# Patient Record
Sex: Female | Born: 1935 | Race: White | Hispanic: No | Smoking: Former smoker
Health system: Southern US, Community
[De-identification: ages and names within clinical notes are randomized; demographics above are authoritative.]

## PROBLEM LIST (undated history)

## (undated) DIAGNOSIS — R197 Diarrhea, unspecified: Secondary | ICD-10-CM

## (undated) DIAGNOSIS — K219 Gastro-esophageal reflux disease without esophagitis: Secondary | ICD-10-CM

## (undated) DIAGNOSIS — Z8489 Family history of other specified conditions: Secondary | ICD-10-CM

## (undated) DIAGNOSIS — T8859XA Other complications of anesthesia, initial encounter: Secondary | ICD-10-CM

## (undated) DIAGNOSIS — M7580 Other shoulder lesions, unspecified shoulder: Secondary | ICD-10-CM

## (undated) DIAGNOSIS — M6530 Trigger finger, unspecified finger: Secondary | ICD-10-CM

## (undated) DIAGNOSIS — M79609 Pain in unspecified limb: Secondary | ICD-10-CM

## (undated) DIAGNOSIS — E785 Hyperlipidemia, unspecified: Secondary | ICD-10-CM

## (undated) DIAGNOSIS — M25569 Pain in unspecified knee: Secondary | ICD-10-CM

## (undated) DIAGNOSIS — M949 Disorder of cartilage, unspecified: Secondary | ICD-10-CM

## (undated) DIAGNOSIS — I10 Essential (primary) hypertension: Secondary | ICD-10-CM

## (undated) DIAGNOSIS — M2550 Pain in unspecified joint: Secondary | ICD-10-CM

## (undated) DIAGNOSIS — F0390 Unspecified dementia without behavioral disturbance: Secondary | ICD-10-CM

## (undated) DIAGNOSIS — R112 Nausea with vomiting, unspecified: Secondary | ICD-10-CM

## (undated) DIAGNOSIS — R5381 Other malaise: Secondary | ICD-10-CM

## (undated) DIAGNOSIS — T4145XA Adverse effect of unspecified anesthetic, initial encounter: Secondary | ICD-10-CM

## (undated) DIAGNOSIS — M899 Disorder of bone, unspecified: Secondary | ICD-10-CM

## (undated) DIAGNOSIS — M1710 Unilateral primary osteoarthritis, unspecified knee: Secondary | ICD-10-CM

## (undated) DIAGNOSIS — J019 Acute sinusitis, unspecified: Secondary | ICD-10-CM

## (undated) DIAGNOSIS — E739 Lactose intolerance, unspecified: Secondary | ICD-10-CM

## (undated) DIAGNOSIS — H409 Unspecified glaucoma: Secondary | ICD-10-CM

## (undated) DIAGNOSIS — R5383 Other fatigue: Secondary | ICD-10-CM

## (undated) DIAGNOSIS — M25579 Pain in unspecified ankle and joints of unspecified foot: Secondary | ICD-10-CM

## (undated) DIAGNOSIS — J309 Allergic rhinitis, unspecified: Secondary | ICD-10-CM

## (undated) DIAGNOSIS — R7302 Impaired glucose tolerance (oral): Secondary | ICD-10-CM

## (undated) DIAGNOSIS — Z9889 Other specified postprocedural states: Secondary | ICD-10-CM

## (undated) DIAGNOSIS — H5440 Blindness, one eye, unspecified eye: Secondary | ICD-10-CM

## (undated) DIAGNOSIS — M25519 Pain in unspecified shoulder: Secondary | ICD-10-CM

## (undated) HISTORY — DX: Disorder of bone, unspecified: M89.9

## (undated) HISTORY — DX: Essential (primary) hypertension: I10

## (undated) HISTORY — DX: Diarrhea, unspecified: R19.7

## (undated) HISTORY — DX: Disorder of cartilage, unspecified: M94.9

## (undated) HISTORY — DX: Trigger finger, unspecified finger: M65.30

## (undated) HISTORY — PX: TUBAL LIGATION: SHX77

## (undated) HISTORY — DX: Unilateral primary osteoarthritis, unspecified knee: M17.10

## (undated) HISTORY — DX: Pain in unspecified ankle and joints of unspecified foot: M25.579

## (undated) HISTORY — DX: Blindness, one eye, unspecified eye: H54.40

## (undated) HISTORY — PX: OTHER SURGICAL HISTORY: SHX169

## (undated) HISTORY — DX: Pain in unspecified limb: M79.609

## (undated) HISTORY — DX: Pain in unspecified joint: M25.50

## (undated) HISTORY — DX: Hyperlipidemia, unspecified: E78.5

## (undated) HISTORY — DX: Other fatigue: R53.83

## (undated) HISTORY — DX: Unspecified glaucoma: H40.9

## (undated) HISTORY — DX: Allergic rhinitis, unspecified: J30.9

## (undated) HISTORY — DX: Lactose intolerance, unspecified: E73.9

## (undated) HISTORY — PX: EYE SURGERY: SHX253

## (undated) HISTORY — DX: Other shoulder lesions, unspecified shoulder: M75.80

## (undated) HISTORY — DX: Pain in unspecified shoulder: M25.519

## (undated) HISTORY — DX: Gastro-esophageal reflux disease without esophagitis: K21.9

## (undated) HISTORY — DX: Other malaise: R53.81

## (undated) HISTORY — DX: Impaired glucose tolerance (oral): R73.02

## (undated) HISTORY — DX: Pain in unspecified knee: M25.569

## (undated) HISTORY — DX: Acute sinusitis, unspecified: J01.90

## (undated) HISTORY — PX: COLONOSCOPY: SHX174

---

## 1997-10-09 ENCOUNTER — Ambulatory Visit: Admission: RE | Admit: 1997-10-09 | Discharge: 1997-10-09 | Payer: Self-pay | Admitting: Internal Medicine

## 1998-08-19 ENCOUNTER — Other Ambulatory Visit: Admission: RE | Admit: 1998-08-19 | Discharge: 1998-08-19 | Payer: Self-pay | Admitting: *Deleted

## 1999-08-28 ENCOUNTER — Other Ambulatory Visit: Admission: RE | Admit: 1999-08-28 | Discharge: 1999-08-28 | Payer: Self-pay | Admitting: Obstetrics and Gynecology

## 2000-10-11 ENCOUNTER — Other Ambulatory Visit: Admission: RE | Admit: 2000-10-11 | Discharge: 2000-10-11 | Payer: Self-pay | Admitting: Obstetrics and Gynecology

## 2004-05-06 ENCOUNTER — Ambulatory Visit: Payer: Self-pay | Admitting: Internal Medicine

## 2004-07-20 ENCOUNTER — Ambulatory Visit: Payer: Self-pay | Admitting: Internal Medicine

## 2004-07-27 ENCOUNTER — Ambulatory Visit: Payer: Self-pay | Admitting: Internal Medicine

## 2004-12-31 ENCOUNTER — Ambulatory Visit: Payer: Self-pay | Admitting: Internal Medicine

## 2005-01-01 ENCOUNTER — Ambulatory Visit: Payer: Self-pay | Admitting: Internal Medicine

## 2005-01-18 ENCOUNTER — Ambulatory Visit: Payer: Self-pay | Admitting: Internal Medicine

## 2005-01-20 ENCOUNTER — Ambulatory Visit: Payer: Self-pay | Admitting: Internal Medicine

## 2005-02-17 ENCOUNTER — Ambulatory Visit: Payer: Self-pay | Admitting: Internal Medicine

## 2005-04-21 ENCOUNTER — Ambulatory Visit: Payer: Self-pay | Admitting: Internal Medicine

## 2005-08-05 ENCOUNTER — Ambulatory Visit: Payer: Self-pay | Admitting: Internal Medicine

## 2005-08-11 ENCOUNTER — Ambulatory Visit: Payer: Self-pay | Admitting: Internal Medicine

## 2006-04-01 ENCOUNTER — Ambulatory Visit: Payer: Self-pay | Admitting: Internal Medicine

## 2006-08-18 ENCOUNTER — Ambulatory Visit: Payer: Self-pay | Admitting: Internal Medicine

## 2006-08-18 LAB — CONVERTED CEMR LAB
ALT: 23 units/L (ref 0–40)
AST: 26 units/L (ref 0–37)
Basophils Relative: 0.7 % (ref 0.0–1.0)
Bilirubin, Direct: 0.1 mg/dL (ref 0.0–0.3)
CO2: 28 meq/L (ref 19–32)
Calcium: 9.4 mg/dL (ref 8.4–10.5)
Chloride: 100 meq/L (ref 96–112)
Cholesterol: 172 mg/dL (ref 0–200)
Creatinine, Ser: 0.8 mg/dL (ref 0.4–1.2)
Eosinophils Absolute: 0.1 10*3/uL (ref 0.0–0.6)
Eosinophils Relative: 1.1 % (ref 0.0–5.0)
GFR calc non Af Amer: 75 mL/min
Glucose, Bld: 112 mg/dL — ABNORMAL HIGH (ref 70–99)
HCT: 40.4 % (ref 36.0–46.0)
Ketones, ur: NEGATIVE mg/dL
LDL Cholesterol: 76 mg/dL (ref 0–99)
Neutrophils Relative %: 64.5 % (ref 43.0–77.0)
Nitrite: NEGATIVE
Platelets: 248 10*3/uL (ref 150–400)
RBC: 4.4 M/uL (ref 3.87–5.11)
RDW: 12.2 % (ref 11.5–14.6)
Specific Gravity, Urine: 1.01 (ref 1.000–1.03)
Total Bilirubin: 0.7 mg/dL (ref 0.3–1.2)
Total CHOL/HDL Ratio: 2.2
Total Protein, Urine: NEGATIVE mg/dL
Urine Glucose: NEGATIVE mg/dL
Urobilinogen, UA: 0.2 (ref 0.0–1.0)
WBC: 6.6 10*3/uL (ref 4.5–10.5)

## 2007-01-18 DIAGNOSIS — I10 Essential (primary) hypertension: Secondary | ICD-10-CM

## 2007-01-18 DIAGNOSIS — E785 Hyperlipidemia, unspecified: Secondary | ICD-10-CM

## 2007-01-18 HISTORY — DX: Essential (primary) hypertension: I10

## 2007-01-18 HISTORY — DX: Hyperlipidemia, unspecified: E78.5

## 2007-04-13 ENCOUNTER — Ambulatory Visit: Payer: Self-pay | Admitting: Internal Medicine

## 2007-05-16 ENCOUNTER — Ambulatory Visit: Payer: Self-pay | Admitting: Internal Medicine

## 2007-05-16 DIAGNOSIS — M7580 Other shoulder lesions, unspecified shoulder: Secondary | ICD-10-CM

## 2007-05-16 DIAGNOSIS — J309 Allergic rhinitis, unspecified: Secondary | ICD-10-CM

## 2007-05-16 DIAGNOSIS — IMO0002 Reserved for concepts with insufficient information to code with codable children: Secondary | ICD-10-CM

## 2007-05-16 DIAGNOSIS — K219 Gastro-esophageal reflux disease without esophagitis: Secondary | ICD-10-CM

## 2007-05-16 DIAGNOSIS — M1710 Unilateral primary osteoarthritis, unspecified knee: Secondary | ICD-10-CM

## 2007-05-16 DIAGNOSIS — M25819 Other specified joint disorders, unspecified shoulder: Secondary | ICD-10-CM

## 2007-05-16 HISTORY — DX: Reserved for concepts with insufficient information to code with codable children: IMO0002

## 2007-05-16 HISTORY — DX: Allergic rhinitis, unspecified: J30.9

## 2007-05-16 HISTORY — DX: Other specified joint disorders, unspecified shoulder: M25.819

## 2007-05-16 HISTORY — DX: Gastro-esophageal reflux disease without esophagitis: K21.9

## 2007-05-29 ENCOUNTER — Ambulatory Visit: Payer: Self-pay | Admitting: Internal Medicine

## 2007-05-29 DIAGNOSIS — J019 Acute sinusitis, unspecified: Secondary | ICD-10-CM

## 2007-05-29 HISTORY — DX: Acute sinusitis, unspecified: J01.90

## 2007-07-31 ENCOUNTER — Encounter: Payer: Self-pay | Admitting: Internal Medicine

## 2007-08-28 ENCOUNTER — Ambulatory Visit: Payer: Self-pay | Admitting: Internal Medicine

## 2007-08-28 DIAGNOSIS — R5383 Other fatigue: Secondary | ICD-10-CM

## 2007-08-28 DIAGNOSIS — R5381 Other malaise: Secondary | ICD-10-CM | POA: Insufficient documentation

## 2007-08-28 HISTORY — DX: Other malaise: R53.81

## 2007-08-28 HISTORY — DX: Other fatigue: R53.83

## 2007-08-28 LAB — CONVERTED CEMR LAB
ALT: 23 units/L (ref 0–35)
AST: 23 units/L (ref 0–37)
Basophils Relative: 1.4 % — ABNORMAL HIGH (ref 0.0–1.0)
Bilirubin, Direct: 0.1 mg/dL (ref 0.0–0.3)
CO2: 28 meq/L (ref 19–32)
Calcium: 9.5 mg/dL (ref 8.4–10.5)
Chloride: 104 meq/L (ref 96–112)
Eosinophils Relative: 1.2 % (ref 0.0–5.0)
Glucose, Bld: 111 mg/dL — ABNORMAL HIGH (ref 70–99)
HCT: 37.6 % (ref 36.0–46.0)
Lymphocytes Relative: 23.9 % (ref 12.0–46.0)
Neutro Abs: 4.1 10*3/uL (ref 1.4–7.7)
Platelets: 260 10*3/uL (ref 150–400)
RBC: 4.16 M/uL (ref 3.87–5.11)
Total Protein: 7.1 g/dL (ref 6.0–8.3)
Triglycerides: 75 mg/dL (ref 0–149)
VLDL: 15 mg/dL (ref 0–40)
WBC: 6.4 10*3/uL (ref 4.5–10.5)

## 2007-09-05 ENCOUNTER — Encounter: Payer: Self-pay | Admitting: Internal Medicine

## 2007-09-05 ENCOUNTER — Ambulatory Visit: Payer: Self-pay | Admitting: Family Medicine

## 2007-09-26 ENCOUNTER — Encounter: Payer: Self-pay | Admitting: Internal Medicine

## 2007-09-26 DIAGNOSIS — M949 Disorder of cartilage, unspecified: Secondary | ICD-10-CM

## 2007-09-26 DIAGNOSIS — M899 Disorder of bone, unspecified: Secondary | ICD-10-CM

## 2007-09-26 HISTORY — DX: Disorder of bone, unspecified: M89.9

## 2008-03-15 ENCOUNTER — Ambulatory Visit: Payer: Self-pay | Admitting: Internal Medicine

## 2008-04-16 ENCOUNTER — Telehealth (INDEPENDENT_AMBULATORY_CARE_PROVIDER_SITE_OTHER): Payer: Self-pay | Admitting: *Deleted

## 2008-09-02 ENCOUNTER — Ambulatory Visit: Payer: Self-pay | Admitting: Internal Medicine

## 2008-09-02 DIAGNOSIS — E739 Lactose intolerance, unspecified: Secondary | ICD-10-CM

## 2008-09-02 HISTORY — DX: Lactose intolerance, unspecified: E73.9

## 2008-11-26 ENCOUNTER — Telehealth (INDEPENDENT_AMBULATORY_CARE_PROVIDER_SITE_OTHER): Payer: Self-pay | Admitting: *Deleted

## 2009-02-12 ENCOUNTER — Telehealth: Payer: Self-pay | Admitting: Internal Medicine

## 2009-04-03 ENCOUNTER — Ambulatory Visit: Payer: Self-pay | Admitting: Internal Medicine

## 2009-05-12 ENCOUNTER — Telehealth: Payer: Self-pay | Admitting: Internal Medicine

## 2009-07-10 ENCOUNTER — Ambulatory Visit: Payer: Self-pay | Admitting: Internal Medicine

## 2009-07-10 DIAGNOSIS — M25569 Pain in unspecified knee: Secondary | ICD-10-CM

## 2009-07-10 DIAGNOSIS — M6530 Trigger finger, unspecified finger: Secondary | ICD-10-CM

## 2009-07-10 DIAGNOSIS — M25519 Pain in unspecified shoulder: Secondary | ICD-10-CM

## 2009-07-10 HISTORY — DX: Pain in unspecified shoulder: M25.519

## 2009-07-10 HISTORY — DX: Pain in unspecified knee: M25.569

## 2009-07-10 HISTORY — DX: Trigger finger, unspecified finger: M65.30

## 2009-08-13 ENCOUNTER — Telehealth: Payer: Self-pay | Admitting: Internal Medicine

## 2009-08-19 ENCOUNTER — Telehealth (INDEPENDENT_AMBULATORY_CARE_PROVIDER_SITE_OTHER): Payer: Self-pay | Admitting: *Deleted

## 2009-08-19 HISTORY — PX: OTHER SURGICAL HISTORY: SHX169

## 2009-08-20 ENCOUNTER — Ambulatory Visit: Payer: Self-pay

## 2009-08-20 ENCOUNTER — Encounter (HOSPITAL_COMMUNITY): Admission: RE | Admit: 2009-08-20 | Discharge: 2009-10-21 | Payer: Self-pay | Admitting: Internal Medicine

## 2009-08-20 ENCOUNTER — Ambulatory Visit: Payer: Self-pay | Admitting: Cardiovascular Disease

## 2009-11-05 ENCOUNTER — Ambulatory Visit: Payer: Self-pay | Admitting: Internal Medicine

## 2009-11-05 LAB — CONVERTED CEMR LAB
Albumin: 3.4 g/dL — ABNORMAL LOW (ref 3.5–5.2)
BUN: 15 mg/dL (ref 6–23)
Basophils Absolute: 0.1 10*3/uL (ref 0.0–0.1)
CO2: 27 meq/L (ref 19–32)
Chloride: 100 meq/L (ref 96–112)
Cholesterol: 138 mg/dL (ref 0–200)
Glucose, Bld: 99 mg/dL (ref 70–99)
HCT: 31.9 % — ABNORMAL LOW (ref 36.0–46.0)
Hemoglobin, Urine: NEGATIVE
Hemoglobin: 10.9 g/dL — ABNORMAL LOW (ref 12.0–15.0)
Hgb A1c MFr Bld: 6 % (ref 4.6–6.5)
Iron: 51 ug/dL (ref 42–145)
Ketones, ur: NEGATIVE mg/dL
Lymphs Abs: 1.4 10*3/uL (ref 0.7–4.0)
MCHC: 34.1 g/dL (ref 30.0–36.0)
MCV: 87.4 fL (ref 78.0–100.0)
Monocytes Absolute: 0.5 10*3/uL (ref 0.1–1.0)
Neutro Abs: 8.6 10*3/uL — ABNORMAL HIGH (ref 1.4–7.7)
Platelets: 407 10*3/uL — ABNORMAL HIGH (ref 150.0–400.0)
Potassium: 3.8 meq/L (ref 3.5–5.1)
RDW: 14.6 % (ref 11.5–14.6)
Saturation Ratios: 16.9 % — ABNORMAL LOW (ref 20.0–50.0)
Sed Rate: 90 mm/hr — ABNORMAL HIGH (ref 0–22)
Specific Gravity, Urine: 1.025 (ref 1.000–1.030)
TSH: 1.73 microintl units/mL (ref 0.35–5.50)
Total Bilirubin: 0.5 mg/dL (ref 0.3–1.2)
Total Protein, Urine: NEGATIVE mg/dL
Urine Glucose: NEGATIVE mg/dL
Urobilinogen, UA: 1 (ref 0.0–1.0)
VLDL: 10.6 mg/dL (ref 0.0–40.0)
Vitamin B-12: 945 pg/mL — ABNORMAL HIGH (ref 211–911)

## 2009-11-06 LAB — CONVERTED CEMR LAB
PTH: 21.9 pg/mL (ref 14.0–72.0)
Vit D, 25-Hydroxy: 49 ng/mL (ref 30–89)

## 2009-11-12 ENCOUNTER — Encounter: Payer: Self-pay | Admitting: Internal Medicine

## 2009-11-12 ENCOUNTER — Ambulatory Visit: Payer: Self-pay | Admitting: Internal Medicine

## 2009-11-20 ENCOUNTER — Encounter: Payer: Self-pay | Admitting: Internal Medicine

## 2010-02-16 ENCOUNTER — Telehealth: Payer: Self-pay | Admitting: Internal Medicine

## 2010-02-24 ENCOUNTER — Ambulatory Visit: Payer: Self-pay | Admitting: Internal Medicine

## 2010-02-24 DIAGNOSIS — R197 Diarrhea, unspecified: Secondary | ICD-10-CM

## 2010-02-24 HISTORY — DX: Diarrhea, unspecified: R19.7

## 2010-03-09 ENCOUNTER — Telehealth: Payer: Self-pay | Admitting: Internal Medicine

## 2010-03-31 ENCOUNTER — Ambulatory Visit: Payer: Self-pay | Admitting: Internal Medicine

## 2010-04-17 ENCOUNTER — Ambulatory Visit (HOSPITAL_COMMUNITY)
Admission: RE | Admit: 2010-04-17 | Discharge: 2010-04-17 | Payer: Self-pay | Source: Home / Self Care | Attending: Orthopedic Surgery | Admitting: Orthopedic Surgery

## 2010-06-08 ENCOUNTER — Ambulatory Visit (HOSPITAL_COMMUNITY)
Admission: RE | Admit: 2010-06-08 | Discharge: 2010-06-09 | Payer: Self-pay | Source: Home / Self Care | Attending: Orthopedic Surgery | Admitting: Orthopedic Surgery

## 2010-07-19 LAB — CONVERTED CEMR LAB
ALT: 18 units/L (ref 0–35)
AST: 20 units/L (ref 0–37)
Alkaline Phosphatase: 76 units/L (ref 39–117)
CO2: 28 meq/L (ref 19–32)
Chloride: 104 meq/L (ref 96–112)
Creatinine, Ser: 0.7 mg/dL (ref 0.4–1.2)
Eosinophils Relative: 2.8 % (ref 0.0–5.0)
Folate: 15.5 ng/mL
HCT: 36.5 % (ref 36.0–46.0)
Hemoglobin: 12.7 g/dL (ref 12.0–15.0)
Iron: 65 ug/dL (ref 42–145)
Monocytes Absolute: 0.6 10*3/uL (ref 0.1–1.0)
Monocytes Relative: 6.1 % (ref 3.0–12.0)
Neutro Abs: 6.8 10*3/uL (ref 1.4–7.7)
Sed Rate: 36 mm/hr — ABNORMAL HIGH (ref 0–22)
TSH: 2.12 microintl units/mL (ref 0.35–5.50)
Total Bilirubin: 0.6 mg/dL (ref 0.3–1.2)
Total CHOL/HDL Ratio: 2.1
Vit D, 25-Hydroxy: 37 ng/mL (ref 30–89)
WBC: 10 10*3/uL (ref 4.5–10.5)

## 2010-07-23 NOTE — Assessment & Plan Note (Signed)
Summary: YEARLY FU/ LABS SAME DAY/ MEDICARE/ NWS   Vital Signs:  Patient profile:   77 year old female Height:      63 inches Weight:      137.50 pounds BMI:     24.45 O2 Sat:      99 % on Room air Temp:     96.7 degrees F oral Pulse rate:   81 / minute BP sitting:   120 / 62  (left arm) Cuff size:   regular  Vitals Entered ByZella Ball Ewing (Nov 05, 2009 8:20 AM)  O2 Flow:  Room air  CC: Yearly/RE   CC:  Yearly/RE.  History of Present Illness: overall doing well, Pt denies CP, sob, doe, wheezing, orthopnea, pnd, worsening LE edema, palps, dizziness or syncope   Pt denies new neuro symptoms such as headache, facial or extremity weakness   denies polydipsia or polyuria.  Trying to follow lower chol diet.  Has some mild ongoing fatigue without fever, wt loss, night sweats or other constitutional symptoms.  No osa symptoms.  Had recent left knee surgury arthroscopy but uncomplicated and trying to improve her overall level of activity as well.    Here for wellness Diet: Heart Healthy or DM if diabetic Physical Activities: Sedentary but will do better when knee improves postop Depression/mood screen: Negative Hearing: Intact bilateral Visual Acuity: Grossly normal, gets exam yearly, due for cataract surgury june 1 ADL's: Capable  Fall Risk: actually less now s/p left knee surgury Home Safety: Good Cognitive Impairment:  Gen appearance, affect, speech, memory, attention & motor skills grossly intact End-of-Life Planning: Advance directive - Full code/I agree   Preventive Screening-Counseling & Management      Drug Use:  no.    Problems Prior to Update: 1)  Preoperative Examination  (ICD-V72.84) 2)  Trigger Finger, Right Middle  (ICD-727.03) 3)  Shoulder Pain, Right  (ICD-719.41) 4)  Knee Pain, Left  (ICD-719.46) 5)  Glucose Intolerance  (ICD-271.3) 6)  Osteopenia  (ICD-733.90) 7)  Fatigue  (ICD-780.79) 8)  Sinusitis- Acute-nos  (ICD-461.9) 9)  Shoulder Impingement  Syndrome, Left  (ICD-726.2) 10)  Osteoarthritis, Knees, Bilateral  (ICD-715.96) 11)  Gerd  (ICD-530.81) 12)  Allergic Rhinitis  (ICD-477.9) 13)  Hypertension  (ICD-401.9) 14)  Hyperlipidemia  (ICD-272.4)  Medications Prior to Update: 1)  Amlodipine Besylate 5 Mg  Tabs (Amlodipine Besylate) .Marland Kitchen.. 1 By Mouth Qd 2)  Crestor 40 Mg  Tabs (Rosuvastatin Calcium) .... 1/2 By Mouth Once Daily 3)  Fexofenadine Hcl 180 Mg Tabs (Fexofenadine Hcl) .Marland Kitchen.. 1 By Mouth Once Daily 4)  Gabapentin 300 Mg Caps (Gabapentin) .... Take 1 Capsule By Mouth Three Times A Day 5)  Hydrochlorothiazide 25 Mg Tabs (Hydrochlorothiazide) .Marland Kitchen.. 1 By Mouth Once Daily 6)  Klor-Con 10 10 Meq Tbcr (Potassium Chloride) .Marland Kitchen.. 1 By Mouth Qd 7)  Micardis 80 Mg Tabs (Telmisartan) .... 1/2 By Mouth Once Daily 8)  Ecotrin Low Strength 81 Mg  Tbec (Aspirin) .Marland Kitchen.. 1po Qd 9)  Tramadol Hcl 50 Mg Tabs (Tramadol Hcl) .Marland Kitchen.. 1 - 2 By Mouth Q 6 Hrs As Needed Pain 10)  Fluticasone Propionate 50 Mcg/act Susp (Fluticasone Propionate) .... 2 Spray/side Once Daily  Current Medications (verified): 1)  Amlodipine Besylate 5 Mg  Tabs (Amlodipine Besylate) .Marland Kitchen.. 1 By Mouth Once Daily 2)  Crestor 40 Mg  Tabs (Rosuvastatin Calcium) .Marland Kitchen.. 1 By Mouth Once Daily 3)  Fexofenadine Hcl 180 Mg Tabs (Fexofenadine Hcl) .Marland Kitchen.. 1 By Mouth Once Daily 4)  Gabapentin 300 Mg  Caps (Gabapentin) .... Take 1 Capsule By Mouth Three Times A Day 5)  Hydrochlorothiazide 25 Mg Tabs (Hydrochlorothiazide) .Marland Kitchen.. 1 By Mouth Once Daily 6)  Klor-Con 10 10 Meq Tbcr (Potassium Chloride) .Marland Kitchen.. 1 By Mouth Once Daily 7)  Micardis 80 Mg Tabs (Telmisartan) .... 1/2 By Mouth Once Daily 8)  Ecotrin Low Strength 81 Mg  Tbec (Aspirin) .Marland Kitchen.. 1po Qd 9)  Tramadol Hcl 50 Mg Tabs (Tramadol Hcl) .Marland Kitchen.. 1 - 2 By Mouth Q 6 Hrs As Needed Pain 10)  Fluticasone Propionate 50 Mcg/act Susp (Fluticasone Propionate) .... 2 Spray/side Once Daily  Allergies (verified): 1)  ! 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, English as a foreign language Drug use-no  Risk Factors: Smoking Status: quit (05/16/2007)  Past Surgical History: Inguinal herniorrhaphy x 3 s/p left knee arthroscopy - march 2011 - Dr Ranell Patrick  Social History: Former Smoker Alcohol use-yes 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         all otherwise negative per pt -    Physical Exam  General:  alert and well-developed.   Head:  normocephalic and atraumatic.   Eyes:  vision grossly intact, pupils equal, and pupils round.   Ears:  R ear normal and L ear normal.   Nose:  no external deformity and no nasal discharge.   Mouth:  no gingival abnormalities and pharynx pink and moist.   Neck:  supple and no masses.   Lungs:  normal respiratory effort and normal breath sounds.   Heart:  normal rate and regular rhythm.   Abdomen:  soft, non-tender, and normal bowel sounds.   Msk:  no joint tenderness and no joint swelling.  , right knee with FROM, nontender , no  effusion Extremities:  no edema, no erythema  Neurologic:  cranial nerves II-XII intact and strength normal in all extremities.     Impression & Recommendations:  Problem # 1:  Preventive Health Care (ICD-V70.0)  Overall doing well, age appropriate education and counseling updated and referral for appropriate preventive services done unless declined, immunizations up to date or declined, diet counseling done if overweight, urged to quit smoking if smokes , most recent labs reviewed and current ordered if appropriate, ecg reviewed or declined (interpretation per ECG scanned in the EMR if done); information regarding Medicare Prevention requirements given if appropriate; speciality referrals updated as appropriate   Orders: First annual wellness visit with prevention plan  (U0454)  Problem # 2:  OSTEOPENIA (ICD-733.90)  due for f/u dxa - pt has number to call and plans to do after cataract surgury june 1; to also check PTH level, and vit d  Orders: T-Parathyroid Hormone, Intact w/ Calcium (09811-91478) T-Vitamin D (25-Hydroxy) (29562-13086)  Problem # 3:  GLUCOSE INTOLERANCE (ICD-271.3) asympt -  for a1c check;  to focus on diet and wt control;  does not need OHA at this time but to check a1c Orders: TLB-A1C / Hgb A1C (Glycohemoglobin) (83036-A1C)  Problem # 4:  HYPERTENSION (ICD-401.9)  Her updated medication list for this problem includes:    Amlodipine Besylate 5 Mg Tabs (Amlodipine besylate) .Marland Kitchen... 1 by mouth once daily    Hydrochlorothiazide 25 Mg Tabs (Hydrochlorothiazide) .Marland Kitchen... 1 by mouth once daily    Micardis 80 Mg Tabs (Telmisartan) .Marland Kitchen... 1/2 by mouth once daily  BP today: 120/62 Prior BP: 162/96 (07/10/2009)  Labs Reviewed: K+: 3.6 (09/02/2008) Creat: : 0.7 (09/02/2008)   Chol: 157 (09/02/2008)   HDL: 73.5 (09/02/2008)   LDL: 66 (09/02/2008)   TG: 90 (09/02/2008) stable overall by hx and exam, ok to continue meds/tx as is   Problem # 5:  HYPERLIPIDEMIA  (ICD-272.4)  Her updated medication list for this problem includes:    Crestor 40 Mg Tabs (Rosuvastatin calcium) .Marland Kitchen... 1 by mouth once daily  Orders: TLB-Lipid Panel (80061-LIPID) Prescription Created Electronically [phone])  Labs Reviewed: SGOT: 20 (09/02/2008)   SGPT: 18 (09/02/2008)   HDL:73.5 (09/02/2008), 75.5 (08/28/2007)  LDL:66 (09/02/2008), 66 (08/28/2007)  Chol:157 (09/02/2008), 156 (08/28/2007)  Trig:90 (09/02/2008), 75 (08/28/2007) stable overall by hx and exam, ok to continue meds/tx as is   Problem # 6:  FATIGUE (ICD-780.79) exam benign, to check labs below; follow with expectant management  Orders: TLB-BMP (Basic Metabolic Panel-BMET) (80048-METABOL) TLB-CBC Platelet - w/Differential (85025-CBCD) TLB-Hepatic/Liver Function Pnl (80076-HEPATIC) TLB-Sedimentation Rate (ESR) (85652-ESR) TLB-IBC Pnl (Iron/FE;Transferrin) (83550-IBC) TLB-B12 + Folate Pnl (82746_82607-B12/FOL) TLB-Udip ONLY (81003-UDIP) TLB-TSH (Thyroid Stimulating Hormone) (84443-TSH)  Complete Medication List: 1)  Amlodipine Besylate 5 Mg Tabs (Amlodipine besylate) .Marland Kitchen.. 1 by mouth once daily 2)  Crestor 40 Mg Tabs (Rosuvastatin calcium) .Marland Kitchen.. 1 by mouth once daily 3)  Fexofenadine Hcl 180 Mg Tabs (Fexofenadine hcl) .Marland Kitchen.. 1 by mouth once daily 4)  Gabapentin 300 Mg Caps (Gabapentin) .... Take 1 capsule by mouth three times a day 5)  Hydrochlorothiazide 25 Mg Tabs (Hydrochlorothiazide) .Marland Kitchen.. 1 by mouth once daily 6)  Klor-con 10 10 Meq Tbcr (Potassium chloride) .Marland Kitchen.. 1 by mouth once daily 7)  Micardis 80 Mg Tabs (Telmisartan) .... 1/2 by mouth once daily 8)  Ecotrin Low Strength 81 Mg Tbec (Aspirin) .Marland Kitchen.. 1po qd 9)  Tramadol Hcl 50 Mg Tabs (Tramadol hcl) .Marland Kitchen.. 1 - 2 by mouth q 6 hrs as needed pain 10)  Fluticasone Propionate 50 Mcg/act Susp (Fluticasone propionate) .... 2 spray/side once daily  Patient Instructions: 1)  please call for you yearly pap smear and mammogram 2)  please call for the bone density  test to be scheduled 3)  Please go to the Lab in the basement for your blood and/or urine tests today  4)  Continue all previous medications as before this visit  5)  Please schedule a follow-up appointment in 1 year or sooner if needed Prescriptions: AMLODIPINE BESYLATE 5 MG  TABS (AMLODIPINE BESYLATE) 1 by mouth once daily  #90 x 3   Entered and Authorized by:   Corwin Levins MD   Signed by:   Corwin Levins MD on 11/05/2009   Method used:   Electronically to        [facility] [phone]* (retail)       [address]       [place], [place]  [address]       Ph: [phone] or [phone]  Fax: [phone]   RxID:   [id number] MICARDIS 80 MG TABS (TELMISARTAN) 1/2 by mouth once daily  #90 x 3   Entered and Authorized by:   Corwin Levins MD   Signed by:   Corwin Levins MD on 11/05/2009   Method used:   Electronically to        [facility] [phone]* (retail)       [address], [place]  [address]       Ph: [phone] or [phone]       Fax: [phone]   RxID:   [id number] KLOR-CON 10 10 MEQ TBCR (POTASSIUM CHLORIDE) 1 by mouth once daily  #90 x 3   Entered and Authorized by:   Corwin Levins MD   Signed by:   Corwin Levins MD on 11/05/2009   Method used:   Electron[facility] to        [facility] [phone]* (retail)       [address]       [place], [place]  [address]       Ph: [phone] or [phone]       Fax: [phone]   RxID:   [id number] HYDROCHLOROTHIAZIDE 25 MG TABS (HYDROCHLOROTHIAZIDE) 1 by mouth once daily  #90 x 3   Entered and Authorized by:   Corwin Levins MD   Signed by:   Corwin Levins MD on 11/05/2009   Method used:   Electron[facility] to        [facility] [phone]* (retail)       [address] [place]  [address]       Ph: [phone] or [phone]       Fax: [phone]   RxID:   [id number] GABAPENTIN 300 MG CAPS (GABAPENTIN) Take 1 capsule by mouth three times a day  #90 x 5   Entered and Authorized  by:   Corwin Levins MD   Signed by:   Corwin Levins MD on 11/05/2009   Method used:   Electronically to        [facility] [phone]* (retail)       [address] [place]  [address]       Ph: [phone] or [phone]       Fax: [phone]   RxID:   [id number] FEXOFENADINE HCL 180 MG TABS (FEXOFENADINE HCL) 1 by mouth once daily  #90 x 3   Entered and Authorized by:   Corwin Levins MD   Signed by:   Corwin Levins MD on 11/05/2009   Method used:   [facility]ron[facility] to        [facility] [place] [facility] [phone]* (retail)       [address], [place]  [address]       Ph: [phone] or [phone]       Fax: [phone]   RxID:   [id number] CRESTOR 40 MG  TABS (ROSUVASTATIN CALCIUM) 1 by mouth once daily  #90 x 3   Entered and Authorized by:   Corwin Levins MD   Signed by:   Corwin Levins MD on 11/05/2009   Method used:   [facility]ronically to        [facility] [phone]* (retail)  [address]       [place], [place]  [address]       Ph: [phone] or [phone]       Fax: [phone]   RxID:   [id number]

## 2010-07-23 NOTE — Progress Notes (Signed)
Summary: Rx req  Phone Note Call from Patient Call back at Home Phone [phone]   Caller: Patient Summary of Call: Pt called stating that she has been having Diarrhea x 2 days, no fever, no abd pain. Pt has been using Immodium with no relief and is requesting an Rx to pharmacy. Initial call taken by: Margaret Pyle, CMA,  February 16, 2010 9:06 AM  Follow-up for Phone Call        ? any fever or blood?  abd pain, n/v? (stronger diarrhea med should be used if any of this going on) Follow-up by: Corwin Levins MD,  February 16, 2010 1:11 PM  Additional Follow-up for Phone Call Additional follow up Details #1::        Pt states, no fever, abd pain, n/v or blood. Pt does report bloating. Additional Follow-up by: Margaret Pyle, CMA,  February 16, 2010 2:01 PM    Additional Follow-up for Phone Call Additional follow up Details #2::    ok for as needed lomotil, but please ask pt to consider also taking metamucil daily, as this can help as well  Follow-up by: Corwin Levins MD,  February 16, 2010 2:13 PM  Additional Follow-up for Phone Call Additional follow up Details #3:: Details for Additional Follow-up Action Taken: Pt informed, [facility] Additional Follow-up by: Margaret Pyle, CMA,  February 16, 2010 3:01 PM  New/Updated Medications: LOMOTIL 2.5-0.025 MG TABS (DIPHENOXYLATE-ATROPINE) 1po as needed loose stool - max 8 tabs per 24 hrs Prescriptions: LOMOTIL 2.5-0.025 MG TABS (DIPHENOXYLATE-ATROPINE) 1po as needed loose stool - max 8 tabs per 24 hrs  #30 x 0   Entered and Authorized by:   Corwin Levins MD   Signed by:   Corwin Levins MD on 02/16/2010   Method used:   Print then Give to Patient   RxID:   [id number]  done hardcopy to LIM side B - dahlia  Corwin Levins MD  February 16, 2010 2:14 PM

## 2010-07-23 NOTE — Progress Notes (Signed)
Summary: Medical clearance  Phone Note Call from Patient Call back at Home Phone [phone]   Caller: Patient Summary of Call: pt called requesting medical letter clearing her for "knee scope" by Dr. Guss Bunde be faxed to his scheduler Natasha Mead @ [phone].  Initial call taken by: Margaret Pyle, CMA,  August 13, 2009 10:04 AM  Follow-up for Phone Call        ideally, she will need stress test due to her age and other risk factors prior to surgury - is she ok if I order that (would likely be final result by mid to late next wk) Follow-up by: Corwin Levins MD,  August 13, 2009 12:49 PM  Additional Follow-up for Phone Call Additional follow up Details #1::        pt informed and is okay with stress test. pt informed to expect call from [facility] with appt info Additional Follow-up by: Margaret Pyle, CMA,  August 13, 2009 1:03 PM  New Problems: PREOPERATIVE EXAMINATION (ICD-V72.84)   Additional Follow-up for Phone Call Additional follow up Details #2::    OK - I will order, and she should hear soon (will order the medication stress test due to her knee) Follow-up by: Corwin Levins MD,  August 13, 2009 1:07 PM  New Problems: PREOPERATIVE EXAMINATION (ICD-V72.84)

## 2010-07-23 NOTE — Miscellaneous (Signed)
Summary: BONE DENSITY  Clinical Lists Changes  Orders: Added new Test order of T-Bone Densitometry (77080) - Signed Added new Test order of T-Lumbar Vertebral Assessment (77082) - Signed 

## 2010-07-23 NOTE — Assessment & Plan Note (Signed)
Summary: FLU SHOT-LB   Nurse Visit   Allergies: 1)  ! Daypro  Orders Added: 1)  Flu Vaccine 48yrs + MEDICARE PATIENTS [Q2039] 2)  Administration Flu vaccine - MCR [G0008] Flu Vaccine Consent Questions     Do you have a history of severe allergic reactions to this vaccine? no    Any prior history of allergic reactions to egg and/or gelatin? no    Do you have a sensitivity to the preservative Thimersol? no    Do you have a past history of Guillan-Barre Syndrome? no    Do you currently have an acute febrile illness? no    Have you ever had a severe reaction to latex? no    Vaccine information given and explained to patient? yes    Are you currently pregnant? no    Lot Number:[id number]   Exp Date:12/19/2010   Site Given  Left Deltoid IMistration Flu vaccine - MCR [G0008] .lbmedflu

## 2010-07-23 NOTE — Assessment & Plan Note (Signed)
Summary: diarrhea/cd   Vital Signs:  Patient profile:   77 year old female Height:      62.5 inches Weight:      130.50 pounds BMI:     23.57 O2 Sat:      98 % on Room air Temp:     97 degrees F oral Pulse rate:   73 / minute BP sitting:   132 / 80  (left arm) Cuff size:   regular  Vitals Entered By: Zella Ball Ewing CMA (AAMA) (February 24, 2010 4:02 PM)  O2 Flow:  Room air CC: Diarrhea for 2 weeks/RE   CC:  Diarrhea for 2 weeks/RE.  History of Present Illness: here with c/o of just over 2 wks loose and watery stools wtihout blood, n/v, abd pain, fever, sick contacts, or recent antibx;  Has lost some wt over the past yr and has had incr stress recently due to husband's cancer.  No prior hx of this in past.  Food seems to "just go through: sometimes.  Pt denies CP, worsening sob, doe, wheezing, orthopnea, pnd, worsening LE edema, palps, dizziness or syncope  Pt denies new neuro symptoms such as headache, facial or extremity weakness  No fever, wt loss, night sweats, loss of appetite or other constitutional symptoms  Lomotil as needed seemed to help  Problems Prior to Update: 1)  Diarrhea  (ICD-787.91) 2)  Preventive Health Care  (ICD-V70.0) 3)  Preoperative Examination  (ICD-V72.84) 4)  Trigger Finger, Right Middle  (ICD-727.03) 5)  Shoulder Pain, Right  (ICD-719.41) 6)  Knee Pain, Left  (ICD-719.46) 7)  Glucose Intolerance  (ICD-271.3) 8)  Osteopenia  (ICD-733.90) 9)  Fatigue  (ICD-780.79) 10)  Sinusitis- Acute-nos  (ICD-461.9) 11)  Shoulder Impingement Syndrome, Left  (ICD-726.2) 12)  Osteoarthritis, Knees, Bilateral  (ICD-715.96) 13)  Gerd  (ICD-530.81) 14)  Allergic Rhinitis  (ICD-477.9) 15)  Hypertension  (ICD-401.9) 16)  Hyperlipidemia  (ICD-272.4)  Medications Prior to Update: 1)  Amlodipine Besylate 5 Mg  Tabs (Amlodipine Besylate) .Marland Kitchen.. 1 By Mouth Once Daily 2)  Crestor 40 Mg  Tabs (Rosuvastatin Calcium) .Marland Kitchen.. 1 By Mouth Once Daily 3)  Fexofenadine Hcl 180 Mg Tabs  (Fexofenadine Hcl) .Marland Kitchen.. 1 By Mouth Once Daily 4)  Gabapentin 300 Mg Caps (Gabapentin) .... Take 1 Capsule By Mouth Three Times A Day 5)  Hydrochlorothiazide 25 Mg Tabs (Hydrochlorothiazide) .Marland Kitchen.. 1 By Mouth Once Daily 6)  Klor-Con 10 10 Meq Tbcr (Potassium Chloride) .Marland Kitchen.. 1 By Mouth Once Daily 7)  Micardis 80 Mg Tabs (Telmisartan) .... 1/2 By Mouth Once Daily 8)  Ecotrin Low Strength 81 Mg  Tbec (Aspirin) .Marland Kitchen.. 1po Qd 9)  Tramadol Hcl 50 Mg Tabs (Tramadol Hcl) .Marland Kitchen.. 1 - 2 By Mouth Q 6 Hrs As Needed Pain 10)  Fluticasone Propionate 50 Mcg/act Susp (Fluticasone Propionate) .... 2 Spray/side Once Daily 11)  Lomotil 2.5-0.025 Mg Tabs (Diphenoxylate-Atropine) .Marland Kitchen.. 1po As Needed Loose Stool - Max 8 Tabs Per 24 Hrs  Current Medications (verified): 1)  Amlodipine Besylate 5 Mg  Tabs (Amlodipine Besylate) .Marland Kitchen.. 1 By Mouth Once Daily 2)  Crestor 40 Mg  Tabs (Rosuvastatin Calcium) .Marland Kitchen.. 1 By Mouth Once Daily 3)  Fexofenadine Hcl 180 Mg Tabs (Fexofenadine Hcl) .Marland Kitchen.. 1 By Mouth Once Daily 4)  Gabapentin 300 Mg Caps (Gabapentin) .... Take 1 Capsule By Mouth Three Times A Day 5)  Hydrochlorothiazide 25 Mg Tabs (Hydrochlorothiazide) .Marland Kitchen.. 1 By Mouth Once Daily 6)  Klor-Con 10 10 Meq Tbcr (Potassium Chloride) .Marland Kitchen.. 1 By  Mouth Once Daily 7)  Micardis 80 Mg Tabs (Telmisartan) .... 1/2 By Mouth Once Daily 8)  Ecotrin Low Strength 81 Mg  Tbec (Aspirin) .Marland Kitchen.. 1po Qd 9)  Tramadol Hcl 50 Mg Tabs (Tramadol Hcl) .Marland Kitchen.. 1 - 2 By Mouth Q 6 Hrs As Needed Pain 10)  Fluticasone Propionate 50 Mcg/act Susp (Fluticasone Propionate) .... 2 Spray/side Once Daily 11)  Lomotil 2.5-0.025 Mg Tabs (Diphenoxylate-Atropine) .Marland Kitchen.. 1po As Needed Loose Stool - Max 8 Tabs Per 24 Hrs  Allergies (verified): 1)  ! 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   Head:  normocephalic and atraumatic.   Eyes:  vision grossly intact, pupils equal, and pupils round.   Ears:  R ear normal and L ear normal.   Nose:  no external deformity and no nasal discharge.   Mouth:  no gingival abnormalities and pharynx pink and moist.   Neck:  supple and no masses.   Lungs:  normal respiratory effort and normal breath sounds.   Heart:  normal rate and regular rhythm.   Abdomen:  soft, non-tender, and normal bowel sounds.   Msk:  no joint tenderness and no joint swelling.   Extremities:  no edema, no erythema  Skin:  color normal and no rashes.   Psych:  not depressed appearing and slightly anxious.     Impression & Recommendations:  Problem # 1:  DIARRHEA (ICD-787.91)  Her updated medication list for this problem includes:    Lomotil 2.5-0.025 Mg Tabs (Diphenoxylate-atropine) .Marland Kitchen... 1po as needed loose stool - max 8 tabs per 24 hrs unclear etiology, exam bening - ? viral vs functinal vs other- ok to follow for now; with lomotil, and metamucil as needed, consider colestipol or welchol for relief as well;  consier GI referral if > 4 wks;  does not appear to need further labs today  Problem # 2:  HYPERTENSION (ICD-401.9)  Her updated medication list for this problem includes:    Amlodipine Besylate 5 Mg Tabs (Amlodipine besylate) .Marland Kitchen... 1 by mouth once daily    Hydrochlorothiazide 25 Mg Tabs (Hydrochlorothiazide) .Marland Kitchen... 1 by mouth once daily    Micardis 80 Mg Tabs (Telmisartan) .Marland Kitchen... 1/2 by  mouth once daily  BP today: 132/80 Prior BP: 120/62 (11/05/2009)  Labs Reviewed: K+: 3.8 (11/05/2009) Creat: : 0.6 (11/05/2009)   Chol: 138 (11/05/2009)   HDL: 73.00 (11/05/2009)   LDL: 54 (11/05/2009)   TG: 53.0 (11/05/2009) stable overall by hx and exam, ok to continue meds/tx as is   Complete Medication List: 1)  Amlodipine Besylate 5 Mg Tabs (Amlodipine besylate) .Marland Kitchen.. 1 by mouth once daily 2)  Crestor 40 Mg Tabs (Rosuvastatin calcium) .Marland Kitchen.. 1 by mouth once daily 3)  Fexofenadine Hcl 180 Mg Tabs (Fexofenadine hcl) .Marland Kitchen.. 1 by mouth once daily 4)  Gabapentin 300 Mg Caps (Gabapentin) .... Take 1 capsule by mouth three times a day 5)  Hydrochlorothiazide  25 Mg Tabs (Hydrochlorothiazide) .Marland Kitchen.. 1 by mouth once daily 6)  Klor-con 10 10 Meq Tbcr (Potassium chloride) .Marland Kitchen.. 1 by mouth once daily 7)  Micardis 80 Mg Tabs (Telmisartan) .... 1/2 by mouth once daily 8)  Ecotrin Low Strength 81 Mg Tbec (Aspirin) .Marland Kitchen.. 1po qd 9)  Tramadol Hcl 50 Mg Tabs (Tramadol hcl) .Marland Kitchen.. 1 - 2 by mouth q 6 hrs as needed pain 10)  Fluticasone Propionate 50 Mcg/act Susp (Fluticasone propionate) .... 2 spray/side once daily 11)  Lomotil 2.5-0.025 Mg Tabs (Diphenoxylate-atropine) .Marland Kitchen.. 1po as needed loose stool - max 8 tabs per 24 hrs  Patient Instructions: 1)  Please take all new medications as prescribed 2)  Continue all previous medications as before this visit  3)  Please also start Metamucil daily OTC 4)  call if this does not help, for prescription for "colestipol" 5)  Call if lasts more than 4 wks for GI referral, or sooner if develop fever, pain or blood 6)  Continue all previous medications as before this visit  7)  Please schedule a follow-up appointment in 6 months for your "yearly medicare exam", or sooner if needed Prescriptions: LOMOTIL 2.5-0.025 MG TABS (DIPHENOXYLATE-ATROPINE) 1po as needed loose stool - max 8 tabs per 24 hrs  #60 x 1   Entered and Authorized by:   Corwin Levins MD   Signed by:   Corwin Levins MD on 02/24/2010   Method used:   Print then Give to Patient   RxID:   [id number]

## 2010-07-23 NOTE — Assessment & Plan Note (Signed)
Summary: knee pain for mos/#/cd   Vital Signs:  Patient profile:   77 year old female Height:      61 inches Weight:      149 pounds BMI:     28.26 O2 Sat:      98 % on Room air Temp:     97.7 degrees F oral Pulse rate:   96 / minute BP sitting:   162 / 96  (left arm) Cuff size:   regular  Vitals Entered ByMarland Kitchen Zella Ball Ewing (July 10, 2009 11:24 AM)  O2 Flow:  Room air CC: knee pain, allergies,cyst on neck/RE   CC:  knee pain, allergies, and cyst on neck/RE.  History of Present Illness: here with several issues;  worst is left knee pain increased in the last 2 wks with warmth and swelling, with some sense of instability but no falls, fever, injury, hx of gout, wt loss, night sweats.  Also with several months ongoing right shoudler pain and decrased ROM without injury , neck pain or extrmeity pain, weakness or numbness; cannot remember any specific injury, and no loss of grip strength.  Does have trigger finger on the 3rd finger right hand for many months as well, no pain but does not work well adn she has to extend the finger manually to use it.  Pt denies CP, sob, doe, wheezing, orthopnea, pnd, worsening LE edema, palps, dizziness or syncope  Pt denies new neuro symptoms such as headache, facial or extremity weakness  Also with recent infected seb cyst to the ant neck tx with antibs and now pain, redness, sweling resolved, and wanted a "second opinion" as the dermatologist who tx it wanted to remove the cyst as well.    Problems Prior to Update: 1)  Trigger Finger, Right Middle  (ICD-727.03) 2)  Shoulder Pain, Right  (ICD-719.41) 3)  Knee Pain, Left  (ICD-719.46) 4)  Glucose Intolerance  (ICD-271.3) 5)  Osteopenia  (ICD-733.90) 6)  Fatigue  (ICD-780.79) 7)  Sinusitis- Acute-nos  (ICD-461.9) 8)  Shoulder Impingement Syndrome, Left  (ICD-726.2) 9)  Osteoarthritis, Knees, Bilateral  (ICD-715.96) 10)  Gerd  (ICD-530.81) 11)  Allergic Rhinitis  (ICD-477.9) 12)  Hypertension   (ICD-401.9) 13)  Hyperlipidemia  (ICD-272.4)  Medications Prior to Update: 1)  Amlodipine Besylate 5 Mg  Tabs (Amlodipine Besylate) .Marland Kitchen.. 1 By Mouth Qd 2)  Crestor 40 Mg  Tabs (Rosuvastatin Calcium) .... 1/2 By Mouth Once Daily 3)  Fexofenadine Hcl 180 Mg Tabs (Fexofenadine Hcl) .Marland Kitchen.. 1 By Mouth Once Daily 4)  Gabapentin 300 Mg Caps (Gabapentin) .... Take 1 Capsule By Mouth Three Times A Day 5)  Hydrochlorothiazide 25 Mg Tabs (Hydrochlorothiazide) .Marland Kitchen.. 1 By Mouth Once Daily 6)  Klor-Con 10 10 Meq Tbcr (Potassium Chloride) .Marland Kitchen.. 1 By Mouth Qd 7)  Micardis 80 Mg Tabs (Telmisartan) .... 1/2 By Mouth Once Daily 8)  Ecotrin Low Strength 81 Mg  Tbec (Aspirin) .Marland Kitchen.. 1po Qd 9)  Tramadol Hcl 50 Mg Tabs (Tramadol Hcl) .Marland Kitchen.. 1po Q 6 Hrs As Needed Pain  Current Medications (verified): 1)  Amlodipine Besylate 5 Mg  Tabs (Amlodipine Besylate) .Marland Kitchen.. 1 By Mouth Qd 2)  Crestor 40 Mg  Tabs (Rosuvastatin Calcium) .... 1/2 By Mouth Once Daily 3)  Fexofenadine Hcl 180 Mg Tabs (Fexofenadine Hcl) .Marland Kitchen.. 1 By Mouth Once Daily 4)  Gabapentin 300 Mg Caps (Gabapentin) .... Take 1 Capsule By Mouth Three Times A Day 5)  Hydrochlorothiazide 25 Mg Tabs (Hydrochlorothiazide) .Marland Kitchen.. 1 By Mouth Once Daily 6)  Klor-Con 10 10 Meq Tbcr (Potassium Chloride) .Marland Kitchen.. 1 By Mouth Qd 7)  Micardis 80 Mg Tabs (Telmisartan) .... 1/2 By Mouth Once Daily 8)  Ecotrin Low Strength 81 Mg  Tbec (Aspirin) .Marland Kitchen.. 1po Qd 9)  Tramadol Hcl 50 Mg Tabs (Tramadol Hcl) .Marland Kitchen.. 1 - 2 By Mouth Q 6 Hrs As Needed Pain 10)  Fluticasone Propionate 50 Mcg/act Susp (Fluticasone Propionate) .... 2 Spray/side Once Daily  Allergies (verified): 1)  ! 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   Head:  normocephalic and atraumatic.   Eyes:  vision grossly intact, pupils equal, and pupils round.   Ears:  R ear normal and L ear normal.   Nose:  no external deformity and no nasal discharge.   Mouth:  no gingival abnormalities and pharynx pink and moist.   Neck:  supple and no masses.   Lungs:  normal respiratory effort and normal breath sounds.   Heart:  normal rate and regular rhythm.   Msk:  left knee with anterolat warmth and 2+ effusion with much crepitus and decreased ROM;  right shoulder with less pain on ROM but severe decreasd ROM and mild pain to abduction to 90 degrees only;  3rd finger without swelling or erythema and o/w neurovasc intact Extremities:  no edema, no erythema  Skin:  left ant neck with 1cm seb cyst with slight cheesy drainage but nontender and non swollen   Impression & Recommendations:  Problem # 1:  KNEE PAIN, LEFT (ICD-719.46)  Her updated medication list for this problem includes:    Ecotrin Low Strength 81 Mg Tbec (Aspirin) .Marland Kitchen... 1po qd    Tramadol Hcl 50 Mg Tabs (Tramadol hcl) .Marland Kitchen... 1 - 2 by mouth q 6 hrs as needed pain refer ortho, to increae pain med as above, most likely severe DJD and could benefit from cortisone most likely  Orders: Orthopedic Surgeon Referral (Ortho Surgeon)  Problem # 2:  SHOULDER PAIN, RIGHT (ICD-719.41)  Her updated medication list for this problem includes:    Ecotrin Low Strength 81 Mg Tbec (Aspirin) .Marland Kitchen... 1po qd    Tramadol Hcl 50 Mg Tabs (Tramadol hcl) .Marland Kitchen... 1 - 2 by mouth q 6 hrs as needed pain with marked decreased ROM and pain - suspect  rotater cuff vs frozen shoulder - decleins referral for this at this time  Problem # 3:  TRIGGER FINGER, RIGHT MIDDLE (ICD-727.03) d/w pt - declines hand surgeon at this time  Problem # 4:  HYPERTENSION (ICD-401.9)  Her updated medication list for this problem includes:    Amlodipine Besylate 5 Mg Tabs (Amlodipine besylate) .Marland Kitchen... 1 by mouth qd    Hydrochlorothiazide 25 Mg Tabs (Hydrochlorothiazide) .Marland Kitchen... 1 by mouth once daily    Micardis 80 Mg Tabs (Telmisartan) .Marland Kitchen... 1/2 by mouth once daily  BP today: 162/96 Prior BP: 120/78 (09/02/2008)  Labs Reviewed: K+: 3.6 (09/02/2008) Creat: : 0.7 (09/02/2008)   Chol:  157 (09/02/2008)   HDL: 73.5 (09/02/2008)   LDL: 66 (09/02/2008)   TG: 90 (09/02/2008) mild elev today, likely situational, ok to follow, continue same treatment , much anxiety over above pain, to check BP at home, call in 1 wk if persistently elevated  Problem # 5:  ALLERGIC RHINITIS (ICD-477.9)  Her updated medication list for this problem includes:    Fexofenadine Hcl 180 Mg Tabs (Fexofenadine hcl) .Marland Kitchen... 1 by mouth once daily    Fluticasone Propionate 50 Mcg/act Susp (Fluticasone propionate) .Marland Kitchen... 2 spray/side once daily treat as above, f/u any worsening signs or symptoms   Complete Medication List: 1)  Amlodipine Besylate 5 Mg Tabs (Amlodipine besylate) .Marland Kitchen.. 1 by mouth qd 2)  Crestor 40 Mg Tabs (Rosuvastatin calcium) .... 1/2 by mouth once daily 3)  Fexofenadine Hcl 180 Mg Tabs (Fexofenadine hcl) .Marland Kitchen.. 1 by mouth once daily 4)  Gabapentin 300 Mg Caps (Gabapentin) .... Take 1 capsule by mouth three times a day 5)  Hydrochlorothiazide 25 Mg Tabs (Hydrochlorothiazide) .Marland Kitchen.. 1 by mouth once daily 6)  Klor-con 10 10 Meq Tbcr (Potassium chloride) .Marland Kitchen.. 1 by mouth qd 7)  Micardis 80 Mg Tabs (Telmisartan) .... 1/2 by mouth once daily 8)  Ecotrin Low Strength 81 Mg Tbec (Aspirin) .Marland Kitchen.. 1po qd 9)  Tramadol Hcl 50 Mg Tabs (Tramadol hcl) .Marland Kitchen.. 1 - 2 by mouth q 6 hrs as needed pain 10)   Fluticasone Propionate 50 Mcg/act Susp (Fluticasone propionate) .... 2 spray/side once daily  Patient Instructions: 1)  increase the tramadol to 1-2 every 6 hrs as needed for pain 2)  Please take all new medications as prescribed - the generic flonase 3)  Continue all previous medications as before this visit  4)  You will be contacted about the referral(s) to: orthopedic 5)  Please check Blood Pressure over the next and call with results 6)  Please schedule a follow-up appointment in 2 months for Yearly Exam Prescriptions: FLUTICASONE PROPIONATE 50 MCG/ACT SUSP (FLUTICASONE PROPIONATE) 2 spray/side once daily  #1 x 11   Entered and Authorized by:   Corwin Levins MD   Signed by:   Corwin Levins MD on 07/10/2009   Method used:   Print then Give to Patient   RxID:   [id number] TRAMADOL HCL 50 MG TABS (TRAMADOL HCL) 1 - 2 by mouth q 6 hrs as needed pain  #240 x 2   Entered and Authorized by:   Corwin Levins MD   Signed by:   Corwin Levins MD on 07/10/2009   Method used:   Print then Give to Patient   RxID:   [id number]

## 2010-07-23 NOTE — Progress Notes (Signed)
Summary: Nuclear Pre-Procedure  Phone Note Outgoing Call   Call placed by: Milana Na, EMT-P,  August 19, 2009 2:56 PM Summary of Call: Reviewed information on Myoview Information Sheet (see scanned document for further details).  Spoke with patient.     Nuclear Med Background Indications for Stress Test: Evaluation for Ischemia  Indications Comments: Pending Knee Arthroscopy Beverely Low       Nuclear Pre-Procedure Cardiac Risk Factors: History of Smoking, Hypertension, Lipids Height (in): 61  Nuclear Med Study Referring MD:  J.John

## 2010-07-23 NOTE — Letter (Signed)
Summary: Medical Clearance/[facility]   Imported By: Sherian Rein 11/25/2009 09:59:46  _____________________________________________________________________  External Attachment:    Type:   Image     Comment:   External Document

## 2010-07-23 NOTE — Assessment & Plan Note (Signed)
Summary: Cardiology Nuclear Study  Nuclear Med Background Indications for Stress Test: Evaluation for Ischemia  Indications Comments: Pending Knee Arthroscopy Beverely Low    Symptoms: Dizziness, DOE    Nuclear Pre-Procedure Cardiac Risk Factors: History of Smoking, Hypertension, Lipids Caffeine/Decaff Intake: None NPO After: 12:00 AM Lungs: clear IV 0.9% NS with Angio Cath: 22g     IV Site: (R) AC IV Started by: Irean Hong RN Chest Size (in) 36     Cup Size B     Height (in): 62 Weight (lb): 140 BMI: 25.70  Nuclear Med Study 1 or 2 day study:  1 day     Stress Test Type:  Eugenie Birks Reading MD:  Charlton Haws, MD     Referring MD:  J.John Resting Radionuclide:  Technetium 18m Tetrofosmin     Resting Radionuclide Dose:  11.0 mCi  Stress Radionuclide:  Technetium 62m Tetrofosmin     Stress Radionuclide Dose:  32.0 mCi   Stress Protocol   Lexiscan: 0.4 mg   Stress Test Technologist:  Milana Na EMT-P     Nuclear Technologist:  Burna Mortimer Deal RT-N  Rest Procedure  Myocardial perfusion imaging was performed at rest 45 minutes following the intravenous administration of Myoview Technetium 38m Tetrofosmin.  Stress Procedure  The patient received IV Lexiscan 0.4 mg over 15-seconds.  Myoview injected at 30-seconds.  There were no significant changes with infusion.  Quantitative spect images were obtained after a 45 minute delay.  QPS Raw Data Images:  Normal; no motion artifact; normal heart/lung ratio. Stress Images:  NI: Uniform and normal uptake of tracer in all myocardial segments. Rest Images:  Normal homogeneous uptake in all areas of the myocardium. Subtraction (SDS):  Normal Transient Ischemic Dilatation:  1.13  (Normal <1.22)  Lung/Heart Ratio:  .27  (Normal <0.45)  Quantitative Gated Spect Images QGS EDV:  58 ml QGS ESV:  12 ml QGS EF:  79 % QGS cine images:  normal  Findings Normal nuclear study      Overall Impression  Exercise Capacity:  Lexiscan BP Response: Normal blood pressure response. 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 Overall Impression Comments: normal  Appended Document: Cardiology Nuclear Study LMOPT - Stress test as above normal -        OK for proposed orthopedic surgury per Dr Patsy Lager  This note to be faxed to Dr Arva Chafe - to call pt to inform  Appended Document: Cardiology Nuclear Study called pt informed stress test ok. Patient is due for physcial but cannot come in just yet as alot going on with her and her husband, but informed her ok for refills as she was just seen in Jan.

## 2010-07-23 NOTE — Letter (Signed)
Summary: Generic Letter  [facility]  [address] [place], [place] [address]   Phone: [phone]  Fax: [phone]    11/20/2009  Jordan Henderson [address] [place], [place]  [address]  Dear Ms. [facility],       This letter is to state that you are   cleared for cataract surgury from a medical  standpoint.      Sincerely,   Oliver Barre MD

## 2010-07-23 NOTE — Progress Notes (Signed)
Summary: medication change  Phone Note From Pharmacy   Caller: [facility]. [phone]* Summary of Call: Pharmacy requesting due to cost an alternative to micardis (Patient requested). Initial call taken by: Robin Ewing CMA Duncan Dull),  March 09, 2010 8:46 AM  Follow-up for Phone Call        ok to change to lisinopril 20 mg per day  please make nurse visit for BP check 1 wk Follow-up by: Corwin Levins MD,  March 09, 2010 9:17 AM  Additional Follow-up for Phone Call Additional follow up Details #1::        called pt and informed of above information. The patient just picked up a 3 month supply of Micardis and would like to just stay on that since she has already paid for it. She is going to schedule a nurse visit appt. to get a flu shot in a couple weeks and will have BP checked also. Additional Follow-up by: Robin Ewing CMA Duncan Dull),  March 09, 2010 10:54 AM    Additional Follow-up for Phone Call Additional follow up Details #2::    ok  - to stay on the micardis for now, to then change with next refill Follow-up by: Corwin Levins MD,  March 09, 2010 11:43 AM  New/Updated Medications: LISINOPRIL 20 MG TABS (LISINOPRIL) 1po once daily Prescriptions: LISINOPRIL 20 MG TABS (LISINOPRIL) 1po once daily  #90 x 3   Entered and Authorized by:   Corwin Levins MD   Signed by:   Corwin Levins MD on 03/09/2010   Method used:   Electronically to        [facility] [phone]* (retail)       [address]       [place], [place]  [address]       Ph: [phone] or [phone]       Fax: [phone]   RxID:   [id number]

## 2010-07-24 ENCOUNTER — Encounter: Payer: Self-pay | Admitting: Internal Medicine

## 2010-07-24 ENCOUNTER — Encounter (INDEPENDENT_AMBULATORY_CARE_PROVIDER_SITE_OTHER): Payer: Medicare Other

## 2010-07-24 ENCOUNTER — Ambulatory Visit (INDEPENDENT_AMBULATORY_CARE_PROVIDER_SITE_OTHER): Payer: Medicare Other | Admitting: Internal Medicine

## 2010-07-24 DIAGNOSIS — M2550 Pain in unspecified joint: Secondary | ICD-10-CM | POA: Insufficient documentation

## 2010-07-24 DIAGNOSIS — M79609 Pain in unspecified limb: Secondary | ICD-10-CM

## 2010-07-24 DIAGNOSIS — M25579 Pain in unspecified ankle and joints of unspecified foot: Secondary | ICD-10-CM

## 2010-07-24 DIAGNOSIS — M899 Disorder of bone, unspecified: Secondary | ICD-10-CM

## 2010-07-24 DIAGNOSIS — M7989 Other specified soft tissue disorders: Secondary | ICD-10-CM

## 2010-07-24 HISTORY — DX: Pain in unspecified ankle and joints of unspecified foot: M25.579

## 2010-07-24 HISTORY — DX: Pain in unspecified joint: M25.50

## 2010-07-24 HISTORY — DX: Pain in unspecified limb: M79.609

## 2010-07-29 NOTE — Miscellaneous (Signed)
Summary: Orders Update  Clinical Lists Changes  Orders: Added new Test order of Venous Duplex Lower Extremity (Venous Duplex Lower) - Signed  Appended Document: Orders Update LMOPT - labs negative, normal, or stable  - No Acute problem

## 2010-08-06 NOTE — Assessment & Plan Note (Signed)
Summary: left leg pain/lb   Vital Signs:  Patient profile:   77 year old female Height:      62.5 inches Weight:      134.75 pounds BMI:     24.34 O2 Sat:      95 % on Room air Temp:     97.3 degrees F oral Pulse rate:   90 / minute BP sitting:   108 / 60  (left arm) Cuff size:   regular  Vitals Entered By: Margaret Pyle, CMA (July 24, 2010 2:09 PM)  O2 Flow:  Room air CC: LT leg pain since knee surgery 09/2009   CC:  LT leg pain since knee surgery 09/2009.  History of Present Illness: here with daughter;  pt with essentially bedrest  for 3 wks recently post eye tx, working now on ambulating more with walker, and hx somwehat vague but now with midl to mod 3 days left knee/leg/ankle/foot edema with tenderness/pain as well, wihtout fever , redness, ulcer, abscess, recent fall or injury.  Pt denies CP, worsening sob, doe, wheezing, orthopnea, pnd, worsening LE edema, palps, dizziness or syncope  Pt denies new neuro symptoms such as headache, facial or extremity weakness  Pt denies polydipsia, polyuria  Overall good compliance with meds, trying to follow low chol  diet, wt stable, little excercise however  Also with bilat ankle pain and swelin, as well mult other joint aches as well without effusion.  No fever, wt loss, night sweats, loss of appetite or other constitutional symptoms  Overall good compliance with meds, and good tolerability.    Problems Prior to Update: 1)  Pain in Joint, Multiple Sites  (ICD-719.49) 2)  Ankle Pain, Bilateral  (ICD-719.47) 3)  Leg Pain, Left  (ICD-729.5) 4)  Diarrhea  (ICD-787.91) 5)  Preventive Health Care  (ICD-V70.0) 6)  Preoperative Examination  (ICD-V72.84) 7)  Trigger Finger, Right Middle  (ICD-727.03) 8)  Shoulder Pain, Right  (ICD-719.41) 9)  Knee Pain, Left  (ICD-719.46) 10)  Glucose Intolerance  (ICD-271.3) 11)  Osteopenia  (ICD-733.90) 12)  Fatigue  (ICD-780.79) 13)  Sinusitis- Acute-nos  (ICD-461.9) 14)  Shoulder  Impingement Syndrome, Left  (ICD-726.2) 15)  Osteoarthritis, Knees, Bilateral  (ICD-715.96) 16)  Gerd  (ICD-530.81) 17)  Allergic Rhinitis  (ICD-477.9) 18)  Hypertension  (ICD-401.9) 19)  Hyperlipidemia  (ICD-272.4)  Medications Prior to Update: 1)  Amlodipine Besylate 5 Mg  Tabs (Amlodipine Besylate) .Marland Kitchen.. 1 By Mouth Once Daily 2)  Crestor 40 Mg  Tabs (Rosuvastatin Calcium) .Marland Kitchen.. 1 By Mouth Once Daily 3)  Fexofenadine Hcl 180 Mg Tabs (Fexofenadine Hcl) .Marland Kitchen.. 1 By Mouth Once Daily 4)  Gabapentin 300 Mg Caps (Gabapentin) .... Take 1 Capsule By Mouth Three Times A Day 5)  Hydrochlorothiazide 25 Mg Tabs (Hydrochlorothiazide) .Marland Kitchen.. 1 By Mouth Once Daily 6)  Klor-Con 10 10 Meq Tbcr (Potassium Chloride) .Marland Kitchen.. 1 By Mouth Once Daily 7)  Lisinopril 20 Mg Tabs (Lisinopril) .Marland Kitchen.. 1po Once Daily 8)  Ecotrin Low Strength 81 Mg  Tbec (Aspirin) .Marland Kitchen.. 1po Qd 9)  Tramadol Hcl 50 Mg Tabs (Tramadol Hcl) .Marland Kitchen.. 1 - 2 By Mouth Q 6 Hrs As Needed Pain 10)  Fluticasone Propionate 50 Mcg/act Susp (Fluticasone Propionate) .... 2 Spray/side Once Daily 11)  Lomotil 2.5-0.025 Mg Tabs (Diphenoxylate-Atropine) .Marland Kitchen.. 1po As Needed Loose Stool - Max 8 Tabs Per 24 Hrs  Current Medications (verified): 1)  Amlodipine Besylate 5 Mg  Tabs (Amlodipine Besylate) .Marland Kitchen.. 1 By Mouth Once Daily 2)  Crestor 40 Mg  Tabs (Rosuvastatin Calcium) .Marland Kitchen.. 1 By Mouth Once Daily 3)  Fexofenadine Hcl 180 Mg Tabs (Fexofenadine Hcl) .Marland Kitchen.. 1 By Mouth Once Daily 4)  Gabapentin 300 Mg Caps (Gabapentin) .... Take 1 Capsule By Mouth Three Times A Day 5)  Hydrochlorothiazide 25 Mg Tabs (Hydrochlorothiazide) .Marland Kitchen.. 1 By Mouth Once Daily 6)  Klor-Con 10 10 Meq Tbcr (Potassium Chloride) .Marland Kitchen.. 1 By Mouth Once Daily 7)  Lisinopril 20 Mg Tabs (Lisinopril) .Marland Kitchen.. 1po Once Daily 8)  Ecotrin Low Strength 81 Mg  Tbec (Aspirin) .Marland Kitchen.. 1po Qd 9)  Tramadol Hcl 50 Mg Tabs (Tramadol Hcl) .Marland Kitchen.. 1 - 2 By Mouth Q 6 Hrs As Needed Pain 10)  Fluticasone Propionate 50 Mcg/act Susp  (Fluticasone Propionate) .... 2 Spray/side Once Daily 11)  Lomotil 2.5-0.025 Mg Tabs (Diphenoxylate-Atropine) .Marland Kitchen.. 1po As Needed Loose Stool - Max 8 Tabs Per 24 Hrs  Allergies (verified): 1)  ! Daypro  Past History:  Past Medical History: Last updated: 09/02/2008 Hyperlipidemia Hypertension post-herpetic neuralgia hx of h pylori gastritis - tx 8/06 Allergic rhinitis DJD GERD hx of fibrocystic breast dz Osteopenia glucose intolerance  Past Surgical History: Last updated: 11/05/2009 Inguinal herniorrhaphy x 3 s/p left knee arthroscopy - march 2011 - Dr Ranell Patrick  Social History: Last updated: 11/05/2009 Former Smoker Alcohol use-yes Married 4 children reitred - Teacher, English as a foreign language Drug use-no  Risk Factors: Smoking Status: quit (05/16/2007)  Review of Systems       all otherwise negative per pt -    Physical Exam  General:  alert and well-nourished.   Head:  normocephalic and atraumatic.   Eyes:  vision grossly intact, pupils equal, and pupils round.   Ears:  R ear normal and L ear normal.   Nose:  no external deformity and no nasal discharge.   Mouth:  no gingival abnormalities and pharynx pink and moist.   Neck:  supple and no masses.   Lungs:  normal respiratory effort and normal breath sounds.   Heart:  normal rate and regular rhythm.   Msk:  left knee effusion with decreasd ROM, mild swollen and tender left calf, bilat 1+ ankle effusion and left pedal edema Extremities:  no edema, no erythema    Impression & Recommendations:  Problem # 1:  LEG PAIN, LEFT (ICD-729.5)  with diffuse left knee and leg and foot swelling  - ? related to knee DJD vs ? DVT s/p  bedrest for 3 wks per optho post eye surgury - will check LE dopplers - r/o DVT  (pt with hx of superfic phlebitis in the past as well) - tx pending evaluation  Orders: Radiology Referral (Radiology)  Problem # 2:  ANKLE PAIN, BILATERAL (ICD-719.47)  with effusions - prob DJD - for ortho referral  - Dr Lestine Box  Orders: Orthopedic Surgeon Referral (Ortho Surgeon)  Problem # 3:  PAIN IN JOINT, MULTIPLE SITES (ICD-719.49)  for rheum eval with marked elev sed rate last labs as well   Orders: Rheumatology Referral (Rheumatology)  Problem # 4:  OSTEOPENIA (ICD-733.90) d/w pt most recent dxa - declines meds at this time such as bisphosphonate  Complete Medication List: 1)  Amlodipine Besylate 5 Mg Tabs (Amlodipine besylate) .Marland Kitchen.. 1 by mouth once daily 2)  Crestor 40 Mg Tabs (Rosuvastatin calcium) .Marland Kitchen.. 1 by mouth once daily 3)  Fexofenadine Hcl 180 Mg Tabs (Fexofenadine hcl) .Marland Kitchen.. 1 by mouth once daily 4)  Gabapentin 300 Mg Caps (Gabapentin) .... Take 1 capsule by mouth three times a day 5)  Hydrochlorothiazide  25 Mg Tabs (Hydrochlorothiazide) .Marland Kitchen.. 1 by mouth once daily 6)  Klor-con 10 10 Meq Tbcr (Potassium chloride) .Marland Kitchen.. 1 by mouth once daily 7)  Lisinopril 20 Mg Tabs (Lisinopril) .Marland Kitchen.. 1po once daily 8)  Ecotrin Low Strength 81 Mg Tbec (Aspirin) .Marland Kitchen.. 1po qd 9)  Tramadol Hcl 50 Mg Tabs (Tramadol hcl) .Marland Kitchen.. 1 - 2 by mouth q 6 hrs as needed pain 10)  Fluticasone Propionate 50 Mcg/act Susp (Fluticasone propionate) .... 2 spray/side once daily 11)  Lomotil 2.5-0.025 Mg Tabs (Diphenoxylate-atropine) .Marland Kitchen.. 1po as needed loose stool - max 8 tabs per 24 hrs  Patient Instructions: 1)  please see the PCC's to help get the vein test for the left leg today 2)  You will be contacted about the referral(s) to: orthopedic (dr Lestine Box) and rheumatology 3)  Continue all previous medications as before this visit - including the tramadol at up to 2 every 6 hrs for pain 4)  Please schedule a follow-up appointment in 3 months, or sooner if needed Prescriptions: TRAMADOL HCL 50 MG TABS (TRAMADOL HCL) 1 - 2 by mouth q 6 hrs as needed pain  #240 x 2   Entered and Authorized by:   Corwin Levins MD   Signed by:   Corwin Levins MD on 07/24/2010   Method used:   Print then Give to Patient   RxID:    [id number]    Orders Added: 1)  Radiology Referral [Radiology] 2)  Orthopedic Surgeon Referral Gaylord Shih Surgeon] 3)  Rheumatology Referral [Rheumatology] 4)  Est. Patient Level IV [78469]

## 2010-08-10 ENCOUNTER — Encounter: Payer: Self-pay | Admitting: Internal Medicine

## 2010-08-27 NOTE — Consult Note (Signed)
Summary: Erma Heritage MD  Erma Heritage MD   Imported By: Sherian Rein 08/19/2010 09:24:17  _____________________________________________________________________  External Attachment:    Type:   Image     Comment:   External Document

## 2010-08-31 LAB — BASIC METABOLIC PANEL
CO2: 26 mEq/L (ref 19–32)
Calcium: 9.7 mg/dL (ref 8.4–10.5)
Creatinine, Ser: 0.72 mg/dL (ref 0.4–1.2)
GFR calc Af Amer: >60 mL/min (ref >60)
GFR calc non Af Amer: >60 mL/min (ref >60)
Sodium: 137 mEq/L (ref 135–145)

## 2010-08-31 LAB — CBC
Hemoglobin: 11.3 g/dL — ABNORMAL LOW (ref 12.0–15.0)
MCH: 29.4 pg (ref 26.0–34.0)
Platelets: 418 10*3/uL — ABNORMAL HIGH (ref 150–400)
RBC: 3.85 MIL/uL — ABNORMAL LOW (ref 3.87–5.11)
WBC: 9.3 10*3/uL (ref 4.0–10.5)

## 2010-08-31 LAB — SURGICAL PCR SCREEN
MRSA, PCR: NEGATIVE
Staphylococcus aureus: NEGATIVE

## 2010-09-02 LAB — CBC
MCV: 88.7 fL (ref 78.0–100.0)
Platelets: 306 10*3/uL (ref 150–400)
RBC: 3.8 MIL/uL — ABNORMAL LOW (ref 3.87–5.11)
RDW: 14.4 % (ref 11.5–15.5)
WBC: 6.6 10*3/uL (ref 4.0–10.5)

## 2010-09-02 LAB — SURGICAL PCR SCREEN
MRSA, PCR: NEGATIVE
Staphylococcus aureus: NEGATIVE

## 2010-09-02 LAB — BASIC METABOLIC PANEL
BUN: 12 mg/dL (ref 6–23)
Calcium: 9.5 mg/dL (ref 8.4–10.5)
Chloride: 102 mEq/L (ref 96–112)
Creatinine, Ser: 0.6 mg/dL (ref 0.4–1.2)
GFR calc Af Amer: >60 mL/min (ref >60)
GFR calc non Af Amer: >60 mL/min (ref >60)

## 2010-12-07 ENCOUNTER — Other Ambulatory Visit: Payer: Self-pay

## 2010-12-07 MED ORDER — HYDROCHLOROTHIAZIDE 25 MG PO TABS: 25.0000 mg | ORAL_TABLET | Freq: Every day | ORAL | Status: DC

## 2010-12-07 MED ORDER — AMLODIPINE BESYLATE 5 MG PO TABS: 5.0000 mg | ORAL_TABLET | Freq: Every day | ORAL | Status: DC

## 2010-12-07 NOTE — Telephone Encounter (Signed)
Pt called requesting refill of medications until CPX 08.31.2012

## 2010-12-15 ENCOUNTER — Other Ambulatory Visit: Payer: Self-pay | Admitting: Internal Medicine

## 2011-02-07 ENCOUNTER — Other Ambulatory Visit: Payer: Self-pay | Admitting: Internal Medicine

## 2011-02-16 ENCOUNTER — Encounter: Payer: Self-pay | Admitting: Internal Medicine

## 2011-02-16 DIAGNOSIS — R7302 Impaired glucose tolerance (oral): Secondary | ICD-10-CM

## 2011-02-16 DIAGNOSIS — Z0000 Encounter for general adult medical examination without abnormal findings: Secondary | ICD-10-CM | POA: Insufficient documentation

## 2011-02-16 HISTORY — DX: Impaired glucose tolerance (oral): R73.02

## 2011-02-19 ENCOUNTER — Encounter: Payer: Self-pay | Admitting: Internal Medicine

## 2011-02-19 ENCOUNTER — Ambulatory Visit (INDEPENDENT_AMBULATORY_CARE_PROVIDER_SITE_OTHER): Payer: Medicare Other | Admitting: Internal Medicine

## 2011-02-19 ENCOUNTER — Other Ambulatory Visit (INDEPENDENT_AMBULATORY_CARE_PROVIDER_SITE_OTHER): Payer: Medicare Other

## 2011-02-19 VITALS — BP 122/70 | HR 69 | Temp 97.7°F | Ht 62.5 in | Wt 143.1 lb

## 2011-02-19 DIAGNOSIS — Z23 Encounter for immunization: Secondary | ICD-10-CM

## 2011-02-19 DIAGNOSIS — E785 Hyperlipidemia, unspecified: Secondary | ICD-10-CM

## 2011-02-19 DIAGNOSIS — R5383 Other fatigue: Secondary | ICD-10-CM

## 2011-02-19 DIAGNOSIS — M064 Inflammatory polyarthropathy: Secondary | ICD-10-CM | POA: Insufficient documentation

## 2011-02-19 DIAGNOSIS — R7302 Impaired glucose tolerance (oral): Secondary | ICD-10-CM

## 2011-02-19 DIAGNOSIS — H409 Unspecified glaucoma: Secondary | ICD-10-CM | POA: Insufficient documentation

## 2011-02-19 DIAGNOSIS — R5381 Other malaise: Secondary | ICD-10-CM

## 2011-02-19 DIAGNOSIS — H5440 Blindness, one eye, unspecified eye: Secondary | ICD-10-CM | POA: Insufficient documentation

## 2011-02-19 DIAGNOSIS — I10 Essential (primary) hypertension: Secondary | ICD-10-CM

## 2011-02-19 DIAGNOSIS — R7309 Other abnormal glucose: Secondary | ICD-10-CM

## 2011-02-19 LAB — HEPATIC FUNCTION PANEL
Bilirubin, Direct: 0.1 mg/dL (ref 0.0–0.3)
Total Bilirubin: 0.5 mg/dL (ref 0.3–1.2)
Total Protein: 7.2 g/dL (ref 6.0–8.3)

## 2011-02-19 LAB — CBC WITH DIFFERENTIAL/PLATELET
Basophils Relative: 0.6 % (ref 0.0–3.0)
Eosinophils Absolute: 0.1 10*3/uL (ref 0.0–0.7)
Eosinophils Relative: 1.1 % (ref 0.0–5.0)
Hemoglobin: 11.8 g/dL — ABNORMAL LOW (ref 12.0–15.0)
Lymphocytes Relative: 24.1 % (ref 12.0–46.0)
MCHC: 33.4 g/dL (ref 30.0–36.0)
Monocytes Relative: 7.6 % (ref 3.0–12.0)
Neutro Abs: 6.3 10*3/uL (ref 1.4–7.7)
RBC: 3.62 Mil/uL — ABNORMAL LOW (ref 3.87–5.11)
WBC: 9.5 10*3/uL (ref 4.5–10.5)

## 2011-02-19 LAB — URINALYSIS, ROUTINE W REFLEX MICROSCOPIC
Nitrite: NEGATIVE
Specific Gravity, Urine: 1.01 (ref 1.000–1.030)
Total Protein, Urine: NEGATIVE
Urine Glucose: NEGATIVE
pH: 6.5 (ref 5.0–8.0)

## 2011-02-19 LAB — BASIC METABOLIC PANEL
BUN: 20 mg/dL (ref 6–23)
CO2: 30 mEq/L (ref 19–32)
Calcium: 9.8 mg/dL (ref 8.4–10.5)
Creatinine, Ser: 0.8 mg/dL (ref 0.4–1.2)

## 2011-02-19 LAB — LIPID PANEL
HDL: 101.1 mg/dL (ref >39.00)
LDL Cholesterol: 52 mg/dL (ref 0–99)
Total CHOL/HDL Ratio: 2
Triglycerides: 77 mg/dL (ref 0.0–149.0)

## 2011-02-19 LAB — HEMOGLOBIN A1C: Hgb A1c MFr Bld: 5.8 % (ref 4.6–6.5)

## 2011-02-19 MED ORDER — FLUTICASONE PROPIONATE 50 MCG/ACT NA SUSP: 2.0000 | Freq: Every day | NASAL | Status: DC

## 2011-02-19 MED ORDER — CETIRIZINE HCL 10 MG PO TABS: 10.0000 mg | ORAL_TABLET | Freq: Every day | ORAL | Status: DC

## 2011-02-19 MED ORDER — ROSUVASTATIN CALCIUM 40 MG PO TABS: 40.0000 mg | ORAL_TABLET | Freq: Every day | ORAL | Status: DC

## 2011-02-19 MED ORDER — FOLIC ACID 1 MG PO TABS: 1.0000 mg | ORAL_TABLET | Freq: Every day | ORAL | Status: AC

## 2011-02-19 MED ORDER — GABAPENTIN 300 MG PO CAPS: 300.0000 mg | ORAL_CAPSULE | Freq: Three times a day (TID) | ORAL | Status: DC

## 2011-02-19 MED ORDER — LISINOPRIL 20 MG PO TABS: 20.0000 mg | ORAL_TABLET | Freq: Every day | ORAL | Status: DC

## 2011-02-19 MED ORDER — POTASSIUM CHLORIDE 10 MEQ PO TBCR: 10.0000 meq | EXTENDED_RELEASE_TABLET | Freq: Every day | ORAL | Status: DC

## 2011-02-19 MED ORDER — AMLODIPINE BESYLATE 5 MG PO TABS: 5.0000 mg | ORAL_TABLET | Freq: Every day | ORAL | Status: DC

## 2011-02-19 MED ORDER — HYDROCHLOROTHIAZIDE 25 MG PO TABS: 25.0000 mg | ORAL_TABLET | Freq: Every day | ORAL | Status: DC

## 2011-02-19 MED ORDER — TRAMADOL HCL 50 MG PO TABS: 50.0000 mg | ORAL_TABLET | Freq: Four times a day (QID) | ORAL | Status: DC | PRN

## 2011-02-19 NOTE — Progress Notes (Signed)
Addended by: Scharlene Gloss B on: 02/19/2011 09:44 AM   Modules accepted: Orders

## 2011-02-19 NOTE — Assessment & Plan Note (Signed)
Much improved over last yr, but mild persistent - Etiology unclear, Exam otherwise benign, to check labs as documented, follow with expectant management

## 2011-02-19 NOTE — Assessment & Plan Note (Signed)
BP Readings from Last 3 Encounters:  02/19/11 122/70  07/24/10 108/60  02/24/10 132/80    stable overall by hx and exam, most recent data reviewed with pt, and pt to continue medical treatment as before

## 2011-02-19 NOTE — Assessment & Plan Note (Signed)
Lab Results  Component Value Date   LDLCALC 54 11/05/2009   stable overall by hx and exam, most recent data reviewed with pt, and pt to continue medical treatment as before

## 2011-02-19 NOTE — Progress Notes (Signed)
Subjective:    Patient ID: Jordan Henderson, female    DOB: 1931/04/27, 77 y.o.   MRN: [id number]  HPI Here for wellness and f/u;  Overall doing ok;  Pt denies CP, worsening SOB, DOE, wheezing, orthopnea, PND, worsening LE edema, palpitations, dizziness or syncope.  Pt denies neurological change such as new Headache, facial or extremity weakness.  Pt denies polydipsia, polyuria, or low sugar symptoms. Pt states overall good compliance with treatment and medications, good tolerability, and trying to follow lower cholesterol diet.  Pt denies worsening depressive symptoms, suicidal ideation or panic. No fever, wt loss, night sweats, loss of appetite, or other constitutional symptoms.  Pt states good ability with ADL's, low fall risk, home safety reviewed and adequate, no significant changes in hearing or vision, and occasionally active with exercise. Unfortunately s/p complicated right eye cataract surgury, with eventual optic nerve damage, lack of blood flow and permanent right vision loss.  But overall doing very well compared to the inflammatory polyarthritis situation she had last yr with severe impairment, now followed closely per Dr Kellie Simmering. Does have sense of ongoing fatigue, but denies signficant hypersomnolence.  Past Medical History  Diagnosis Date  . ALLERGIC RHINITIS 05/16/2007  . ANKLE PAIN, BILATERAL 07/24/2010  . Diarrhea 02/24/2010  . FATIGUE 08/28/2007  . GERD 05/16/2007  . GLUCOSE INTOLERANCE 09/02/2008  . HYPERLIPIDEMIA 01/18/2007  . HYPERTENSION 01/18/2007  . KNEE PAIN, LEFT 07/10/2009  . LEG PAIN, LEFT 07/24/2010  . OSTEOARTHRITIS, KNEES, BILATERAL 05/16/2007  . OSTEOPENIA 09/26/2007  . Pain in joint, multiple sites 07/24/2010  . SHOULDER IMPINGEMENT SYNDROME, LEFT 05/16/2007  . SHOULDER PAIN, RIGHT 07/10/2009  . SINUSITIS- ACUTE-NOS 05/29/2007  . TRIGGER FINGER, RIGHT MIDDLE 07/10/2009  . Impaired glucose tolerance 02/16/2011  . Blind right eye   . Glaucoma, left eye    Past Surgical  History  Procedure Date  . Inguinal herniorrhapy     x3  . S/p left knee arthroscopy march 2011    Dr. Ranell Patrick    reports that she has quit smoking. She does not have any smokeless tobacco history on file. She reports that she drinks alcohol. She reports that she does not use illicit drugs. family history includes Dementia in her mother; Hypertension in her other; and Stroke in her father. Allergies  Allergen Reactions  . Oxaprozin     REACTION: rash   Current Outpatient Prescriptions on File Prior to Visit  Medication Sig Dispense Refill  . amLODipine (NORVASC) 5 MG tablet TAKE 1 TABLET BY MOUTH EVERY DAY  90 tablet  1  . aspirin 81 MG tablet Take 81 mg by mouth daily.        . CRESTOR 40 MG tablet TAKE 1 TABLET BY MOUTH EVERY DAY  90 tablet  0  . fexofenadine (ALLEGRA) 180 MG tablet Take 180 mg by mouth daily.        . fluticasone (FLONASE) 50 MCG/ACT nasal spray Place 2 sprays into the nose daily.        Marland Kitchen gabapentin (NEURONTIN) 300 MG capsule Take 300 mg by mouth 3 (three) times daily.        . hydrochlorothiazide 25 MG tablet Take 1 tablet (25 mg total) by mouth daily.  30 tablet  2  . KLOR-CON 10 10 MEQ CR tablet TAKE 1 TABLET BY MOUTH EVERY DAY  90 tablet  0  . lisinopril (PRINIVIL,ZESTRIL) 20 MG tablet Take 20 mg by mouth daily.        . traMADol Janean Sark)  50 MG tablet Take 50 mg by mouth every 6 (six) hours as needed.        . diphenoxylate-atropine (LOMOTIL) 2.5-0.025 MG per tablet 1 by mouth as needed for loose stool, max 8 tablets per 24 hours        Review of Systems Review of Systems  Constitutional: Negative for diaphoresis and unexpected weight change.  HENT: Negative for drooling and tinnitus.   Eyes: Negative for photophobia and visual disturbance.  Respiratory: Negative for choking and stridor.   Gastrointestinal: Negative for vomiting and blood in stool.  Genitourinary: Negative for hematuria and decreased urine volume.  Musculoskeletal: Negative for gait  problem.  Skin: Negative for color change and wound.  Neurological: Negative for tremors and numbness.  Psychiatric/Behavioral: Negative for decreased concentration. The patient is not hyperactive.       Objective:   Physical Exam BP 122/70  Pulse 69  Temp(Src) 97.7 F (36.5 C) (Oral)  Ht 5' 2.5" (1.588 m)  Wt 143 lb 2 oz (64.921 kg)  BMI 25.76 kg/m2  SpO2 97% Physical Exam  VS noted Constitutional: Pt appears well-developed and well-nourished.  HENT: Head: Normocephalic.  Right Ear: External ear normal.  Left Ear: External ear normal.  Eyes: Conjunctivae and EOM are normal. Pupils are equal, round, and reactive to light.  Neck: Normal range of motion. Neck supple.  Cardiovascular: Normal rate and regular rhythm.   Pulmonary/Chest: Effort normal and breath sounds normal.  Abd:  Soft, NT, non-distended, + BS Neurological: Pt is alert. No cranial nerve deficit.  Skin: Skin is warm. No erythema.  Psychiatric: Pt behavior is normal. Thought content normal.  MSk:  No active synovitis       Assessment & Plan:

## 2011-02-19 NOTE — Assessment & Plan Note (Signed)
Asympt;  Continue all other medications as before, for a1c today, cont wt control, diet

## 2011-02-19 NOTE — Patient Instructions (Signed)
Continue all other medications as before Please go to LAB in the Basement for the blood and/or urine tests to be done today Please call the phone number [phone] (the [employer] System) for results of testing in 2-3 days;  When calling, simply dial the number, and when prompted enter the MRN number above (the Medical Record Number) and the # key, then the message should start. You had the flu shot today Please return in 1 year for your yearly visit, or sooner if needed

## 2011-03-14 ENCOUNTER — Other Ambulatory Visit: Payer: Self-pay | Admitting: Internal Medicine

## 2011-05-18 ENCOUNTER — Other Ambulatory Visit: Payer: Self-pay | Admitting: Internal Medicine

## 2011-06-30 ENCOUNTER — Telehealth: Payer: Self-pay | Admitting: *Deleted

## 2011-06-30 NOTE — Telephone Encounter (Signed)
Nov 12, 2009 dxa report  - Done hardcopy to [facility] [employer] [employer]

## 2011-06-30 NOTE — Telephone Encounter (Signed)
Dr Ines Bloomer nurse is doing a study regarding Osteo on patient [referred to Rheumatology]. They are looking for a copy of Bone Density report; only finding Order placed in [facility] on 05.25.2011, but no result report. Could you please let their office know if you have any further information on this test [requesting copy of report faxed to their office at [phone]]. Thanks.

## 2011-07-01 NOTE — Telephone Encounter (Signed)
Faxed copy of results to [phone]

## 2011-11-17 ENCOUNTER — Other Ambulatory Visit: Payer: Self-pay | Admitting: Internal Medicine

## 2012-02-15 ENCOUNTER — Other Ambulatory Visit: Payer: Self-pay | Admitting: Internal Medicine

## 2012-03-05 ENCOUNTER — Other Ambulatory Visit: Payer: Self-pay | Admitting: Internal Medicine

## 2012-03-06 ENCOUNTER — Other Ambulatory Visit: Payer: Self-pay | Admitting: Internal Medicine

## 2012-04-03 ENCOUNTER — Other Ambulatory Visit: Payer: Self-pay | Admitting: Internal Medicine

## 2012-04-29 ENCOUNTER — Other Ambulatory Visit: Payer: Self-pay | Admitting: Internal Medicine

## 2012-05-02 ENCOUNTER — Other Ambulatory Visit: Payer: Self-pay | Admitting: Internal Medicine

## 2012-05-11 ENCOUNTER — Other Ambulatory Visit: Payer: Self-pay | Admitting: Internal Medicine

## 2012-05-20 ENCOUNTER — Other Ambulatory Visit: Payer: Self-pay | Admitting: Internal Medicine

## 2012-05-30 ENCOUNTER — Other Ambulatory Visit: Payer: Self-pay | Admitting: Internal Medicine

## 2012-06-18 ENCOUNTER — Other Ambulatory Visit: Payer: Self-pay | Admitting: Internal Medicine

## 2012-06-28 ENCOUNTER — Other Ambulatory Visit (INDEPENDENT_AMBULATORY_CARE_PROVIDER_SITE_OTHER): Payer: Medicare Other

## 2012-06-28 ENCOUNTER — Ambulatory Visit (INDEPENDENT_AMBULATORY_CARE_PROVIDER_SITE_OTHER): Payer: Medicare Other | Admitting: Internal Medicine

## 2012-06-28 ENCOUNTER — Encounter: Payer: Self-pay | Admitting: Internal Medicine

## 2012-06-28 VITALS — BP 120/80 | HR 82 | Temp 97.5°F | Ht 62.5 in | Wt 147.5 lb

## 2012-06-28 DIAGNOSIS — R7302 Impaired glucose tolerance (oral): Secondary | ICD-10-CM

## 2012-06-28 DIAGNOSIS — R7309 Other abnormal glucose: Secondary | ICD-10-CM

## 2012-06-28 DIAGNOSIS — I10 Essential (primary) hypertension: Secondary | ICD-10-CM

## 2012-06-28 DIAGNOSIS — Z23 Encounter for immunization: Secondary | ICD-10-CM

## 2012-06-28 DIAGNOSIS — E785 Hyperlipidemia, unspecified: Secondary | ICD-10-CM

## 2012-06-28 DIAGNOSIS — L600 Ingrowing nail: Secondary | ICD-10-CM

## 2012-06-28 LAB — CBC WITH DIFFERENTIAL/PLATELET
Eosinophils Relative: 1.8 % (ref 0.0–5.0)
MCV: 95.2 fl (ref 78.0–100.0)
Monocytes Absolute: 0.4 10*3/uL (ref 0.1–1.0)
Neutrophils Relative %: 66.4 % (ref 43.0–77.0)
Platelets: 273 10*3/uL (ref 150.0–400.0)
WBC: 6.3 10*3/uL (ref 4.5–10.5)

## 2012-06-28 LAB — BASIC METABOLIC PANEL
BUN: 12 mg/dL (ref 6–23)
CO2: 30 mEq/L (ref 19–32)
Chloride: 104 mEq/L (ref 96–112)
Glucose, Bld: 103 mg/dL — ABNORMAL HIGH (ref 70–99)
Potassium: 3.7 mEq/L (ref 3.5–5.1)

## 2012-06-28 LAB — URINALYSIS, ROUTINE W REFLEX MICROSCOPIC
Bilirubin Urine: NEGATIVE
Nitrite: NEGATIVE
Urobilinogen, UA: 0.2 (ref 0.0–1.0)
pH: 6 (ref 5.0–8.0)

## 2012-06-28 LAB — LIPID PANEL
Cholesterol: 153 mg/dL (ref 0–200)
LDL Cholesterol: 53 mg/dL (ref 0–99)
Total CHOL/HDL Ratio: 2

## 2012-06-28 LAB — TSH: TSH: 4.36 u[IU]/mL (ref 0.35–5.50)

## 2012-06-28 LAB — HEPATIC FUNCTION PANEL
ALT: 29 U/L (ref 0–35)
AST: 30 U/L (ref 0–37)
Albumin: 4 g/dL (ref 3.5–5.2)
Total Bilirubin: 0.8 mg/dL (ref 0.3–1.2)

## 2012-06-28 MED ORDER — HYDROCHLOROTHIAZIDE 25 MG PO TABS: ORAL_TABLET | ORAL | Status: DC

## 2012-06-28 MED ORDER — FLUTICASONE PROPIONATE 50 MCG/ACT NA SUSP: 2.0000 | Freq: Every day | NASAL | Status: DC

## 2012-06-28 MED ORDER — ROSUVASTATIN CALCIUM 40 MG PO TABS: ORAL_TABLET | ORAL | Status: DC

## 2012-06-28 MED ORDER — POTASSIUM CHLORIDE ER 10 MEQ PO TBCR: EXTENDED_RELEASE_TABLET | ORAL | Status: DC

## 2012-06-28 MED ORDER — CETIRIZINE HCL 10 MG PO TABS: ORAL_TABLET | ORAL | Status: DC

## 2012-06-28 MED ORDER — GABAPENTIN 300 MG PO CAPS: 300.0000 mg | ORAL_CAPSULE | Freq: Three times a day (TID) | ORAL | Status: DC

## 2012-06-28 MED ORDER — AMLODIPINE BESYLATE 5 MG PO TABS: ORAL_TABLET | ORAL | Status: DC

## 2012-06-28 MED ORDER — LISINOPRIL 20 MG PO TABS: ORAL_TABLET | ORAL | Status: DC

## 2012-06-28 MED ORDER — TRAMADOL HCL 50 MG PO TABS: 50.0000 mg | ORAL_TABLET | Freq: Four times a day (QID) | ORAL | Status: DC | PRN

## 2012-06-28 NOTE — Progress Notes (Signed)
Subjective:    Patient ID: Jordan Henderson, female    DOB: 05-Nov-1930, 79 y.o.   MRN: [id number]  HPI  Here to f/u; overall doing ok,  Pt denies chest pain, increased sob or doe, wheezing, orthopnea, PND, increased LE swelling, palpitations, dizziness or syncope.  Pt denies polydipsia, polyuria, or low sugar symptoms such as weakness or confusion improved with po intake.  Pt denies new neurological symptoms such as new headache, or facial or extremity weakness or numbness.   Pt states overall good compliance with meds, has been trying to follow lower cholesterol, diabetic diet, with overall stable,  but little exercise however.  Seeing Dr TRuslow/rheum for OA, and optho for glaucoma.  Woiuld like shingles shot, but declines colonoscopy.  Due for labs, need refills Past Medical History  Diagnosis Date  . ALLERGIC RHINITIS 05/16/2007  . ANKLE PAIN, BILATERAL 07/24/2010  . Diarrhea 02/24/2010  . FATIGUE 08/28/2007  . GERD 05/16/2007  . GLUCOSE INTOLERANCE 09/02/2008  . HYPERLIPIDEMIA 01/18/2007  . HYPERTENSION 01/18/2007  . KNEE PAIN, LEFT 07/10/2009  . LEG PAIN, LEFT 07/24/2010  . OSTEOARTHRITIS, KNEES, BILATERAL 05/16/2007  . OSTEOPENIA 09/26/2007  . Pain in joint, multiple sites 07/24/2010  . SHOULDER IMPINGEMENT SYNDROME, LEFT 05/16/2007  . SHOULDER PAIN, RIGHT 07/10/2009  . SINUSITIS- ACUTE-NOS 05/29/2007  . TRIGGER FINGER, RIGHT MIDDLE 07/10/2009  . Impaired glucose tolerance 02/16/2011  . Blind right eye   . Glaucoma, left eye    Past Surgical History  Procedure Date  . Inguinal herniorrhapy     x3  . S/p left knee arthroscopy march 2011    Dr. Ranell Patrick    reports that she has quit smoking. She does not have any smokeless tobacco history on file. She reports that she drinks alcohol. She reports that she does not use illicit drugs. family history includes Dementia in her mother; Hypertension in her other; and Stroke in her father. Allergies  Allergen Reactions  . Oxaprozin     REACTION: rash    Current Outpatient Prescriptions on File Prior to Visit  Medication Sig Dispense Refill  . amLODipine (NORVASC) 5 MG tablet TAKE 1 TABLET BY MOUTH EVERY DAY  90 tablet  3  . aspirin 81 MG tablet Take 81 mg by mouth daily.        . bimatoprost (LUMIGAN) 0.01 % SOLN 1 drop at bedtime.        . cetirizine (ZYRTEC) 10 MG tablet TAKE 1 TABLET BY MOUTH EVERY DAY  90 tablet  3  . fluticasone (FLONASE) 50 MCG/ACT nasal spray Place 2 sprays into the nose daily.  16 g  5  . folic acid (FOLVITE) 1 MG tablet Take 1 tablet (1 mg total) by mouth daily.  90 tablet  3  . gabapentin (NEURONTIN) 300 MG capsule Take 1 capsule (300 mg total) by mouth 3 (three) times daily.  270 capsule  1  . hydrochlorothiazide (HYDRODIURIL) 25 MG tablet TAKE 1 TABLET BY MOUTH EVERY DAY  90 tablet  3  . methotrexate (RHEUMATREX) 2.5 MG tablet Take 2.5 mg by mouth 2 (two) times a week.        . potassium chloride (KLOR-CON 10) 10 MEQ tablet TAKE 1 TABLET BY MOUTH EVERY DAY  90 tablet  3  . predniSONE (DELTASONE) 5 MG tablet Take 5 mg by mouth daily.        . rosuvastatin (CRESTOR) 40 MG tablet TAKE 1 TABLET BY MOUTH EVERY DAY  90 tablet  3  Review of Systems  Constitutional: Negative for unexpected weight change, or unusual diaphoresis  HENT: Negative for tinnitus.   Eyes: Negative for photophobia and visual disturbance.  Respiratory: Negative for choking and stridor.   Gastrointestinal: Negative for vomiting and blood in stool.  Genitourinary: Negative for hematuria and decreased urine volume.  Musculoskeletal: Negative for acute joint swelling Skin: Negative for color change and wound.  Neurological: Negative for tremors and numbness other than noted  Psychiatric/Behavioral: Negative for decreased concentration or  hyperactivity.       Objective:   Physical Exam BP 120/80  Pulse 82  Temp 97.5 F (36.4 C) (Oral)  Ht 5' 2.5" (1.588 m)  Wt 147 lb 8 oz (66.906 kg)  BMI 26.55 kg/m2  SpO2 98% VS noted,   Constitutional: Pt appears well-developed and well-nourished.  HENT: Head: NCAT.  Right Ear: External ear normal.  Left Ear: External ear normal.  Eyes: Conjunctivae and EOM are normal. Pupils are equal, round, and reactive to light.  Neck: Normal range of motion. Neck supple.  Cardiovascular: Normal rate and regular rhythm.   Pulmonary/Chest: Effort normal and breath sounds normal.  Abd:  Soft, NT, non-distended, + BS Neurological: Pt is alert. Not confused  Skin: Skin is warm. No erythema.  Psychiatric: Pt behavior is normal. Thought content normal.     Assessment & Plan:

## 2012-06-28 NOTE — Assessment & Plan Note (Signed)
ECG reviewed as per emr, stable overall by hx and exam, most recent data reviewed with pt, and pt to continue medical treatment as before BP Readings from Last 3 Encounters:  06/28/12 120/80  02/19/11 122/70  07/24/10 108/60

## 2012-06-28 NOTE — Patient Instructions (Addendum)
You had the flu shot today, and the shingles shot Your EKG was good today Please go to LAB in the Basement for the blood and/or urine tests to be done today You will be contacted by phone if any changes need to be made immediately.  Otherwise, you will receive a letter about your results with an explanation Please consider to sign up for My Chart at your earliest convenience, as this will be important to you in the future with finding out test results. Continue all other medications as before You are given the refills today (except for the prednisone, methotrexate and your eye drops) You will be contacted regarding the referral for: Podiatry for the ingrown nail Please keep your appointments with your specialists as you have planned - Dr Truslow/rheumatology, and opthomology Please call if you change your mind about the colonoscopy You are otherwise up to date with prevention Please return in 1 year for your yearly visit, or sooner if needed

## 2012-07-01 ENCOUNTER — Encounter: Payer: Self-pay | Admitting: Internal Medicine

## 2012-07-01 NOTE — Assessment & Plan Note (Signed)
stable overall by history and exam, recent data reviewed with pt, and pt to continue medical treatment as before,  to f/u any worsening symptoms or concerns Lab Results  Component Value Date   LDLCALC 53 06/28/2012

## 2012-07-01 NOTE — Assessment & Plan Note (Signed)
stable overall by history and exam, recent data reviewed with pt, and pt to continue medical treatment as before,  to f/u any worsening symptoms or concerns Lab Results  Component Value Date   HGBA1C 5.8 06/28/2012

## 2012-07-24 ENCOUNTER — Other Ambulatory Visit: Payer: Self-pay | Admitting: Internal Medicine

## 2012-08-15 ENCOUNTER — Other Ambulatory Visit: Payer: Self-pay | Admitting: Internal Medicine

## 2012-09-13 ENCOUNTER — Other Ambulatory Visit: Payer: Self-pay | Admitting: Internal Medicine

## 2012-12-26 ENCOUNTER — Telehealth: Payer: Self-pay

## 2012-12-26 NOTE — Telephone Encounter (Signed)
Informed the daughter.  She stated she had already stopped that BP medication in June.  Please advise.

## 2012-12-26 NOTE — Telephone Encounter (Signed)
Ok to stop the amlodipine 5 mg  OV in 3 wks

## 2012-12-26 NOTE — Telephone Encounter (Signed)
The patients is still having  low BP's (sunday was 85/62).  PCP removed one BP med. At last OV due to dizziness and low BP.  The patient is not having any more dizziness, but continues to have low BP and very tired.  Please advise if need to make another change in BP medications.  Call back is Jordan Henderson Offer cell number [phone].

## 2012-12-27 NOTE — Telephone Encounter (Signed)
Ok for now to stop the HCTZ as well

## 2012-12-27 NOTE — Telephone Encounter (Signed)
Daughter informed and did schedule f/u appt. With Dr. Jonny Ruiz

## 2012-12-27 NOTE — Telephone Encounter (Signed)
The daughter called back as no call back on what to do.  The patient did already stop amlodipine in June please advise on what to do

## 2013-01-04 ENCOUNTER — Encounter: Payer: Self-pay | Admitting: Internal Medicine

## 2013-01-04 ENCOUNTER — Ambulatory Visit (INDEPENDENT_AMBULATORY_CARE_PROVIDER_SITE_OTHER): Payer: Medicare Other | Admitting: Internal Medicine

## 2013-01-04 VITALS — BP 108/62 | HR 82 | Temp 97.1°F | Wt 140.4 lb

## 2013-01-04 DIAGNOSIS — R7302 Impaired glucose tolerance (oral): Secondary | ICD-10-CM

## 2013-01-04 DIAGNOSIS — I10 Essential (primary) hypertension: Secondary | ICD-10-CM

## 2013-01-04 DIAGNOSIS — E785 Hyperlipidemia, unspecified: Secondary | ICD-10-CM

## 2013-01-04 DIAGNOSIS — R7309 Other abnormal glucose: Secondary | ICD-10-CM

## 2013-01-04 NOTE — Assessment & Plan Note (Signed)
Not clear why but appears still overcontrolled despite stopping 2 of her 3 BP meds, ok to stop the lisinopril as well, f/u 1 mo,  to f/u any worsening symptoms or concerns

## 2013-01-04 NOTE — Progress Notes (Signed)
Subjective:    Patient ID: Jordan Henderson, female    DOB: [birth year], 79 y.o.   MRN: [id number]  HPI  Here to f/u  With daughter - Saw rheumatoloogy in June, was unstable with walking and fatigued, was on 3 BP meds, so taken off amlodipine June 6.  Daughter helping more now, checked her BP at [facility] 4 days ago wit SBP 95, so HCTZ stopped.  Though BP slight increased, still realtively low with fatigue, occas lightheaded and unsteady with walking with cane.  Pt denies chest pain, increased sob or doe, wheezing, orthopnea, PND, increased LE swelling, palpitations,  or syncope.  Pt denies fever, wt loss, night sweats, loss of appetite, or other constitutional symptoms.  Has only lost 3 lbs in 6 mo No recent falls Past Medical History  Diagnosis Date  . ALLERGIC RHINITIS 05/16/2007  . ANKLE PAIN, BILATERAL 07/24/2010  . Diarrhea 02/24/2010  . FATIGUE 08/28/2007  . GERD 05/16/2007  . GLUCOSE INTOLERANCE 09/02/2008  . HYPERLIPIDEMIA 01/18/2007  . HYPERTENSION 01/18/2007  . KNEE PAIN, LEFT 07/10/2009  . LEG PAIN, LEFT 07/24/2010  . OSTEOARTHRITIS, KNEES, BILATERAL 05/16/2007  . OSTEOPENIA 09/26/2007  . Pain in joint, multiple sites 07/24/2010  . SHOULDER IMPINGEMENT SYNDROME, LEFT 05/16/2007  . SHOULDER PAIN, RIGHT 07/10/2009  . SINUSITIS- ACUTE-NOS 05/29/2007  . TRIGGER FINGER, RIGHT MIDDLE 07/10/2009  . Impaired glucose tolerance 02/16/2011  . Blind right eye   . Glaucoma, left eye    Past Surgical History  Procedure Laterality Date  . Inguinal herniorrhapy      x3  . S/p left knee arthroscopy  march 2011    Dr. Ranell Patrick    reports that she has quit smoking. She does not have any smokeless tobacco history on file. She reports that  drinks alcohol. She reports that she does not use illicit drugs. family history includes Dementia in her mother; Hypertension in her other; and Stroke in her father. Allergies  Allergen Reactions  . Oxaprozin     REACTION: rash   Current Outpatient Prescriptions on File Prior  to Visit  Medication Sig Dispense Refill  . aspirin 81 MG tablet Take 81 mg by mouth daily.        . bimatoprost (LUMIGAN) 0.01 % SOLN 1 drop at bedtime.        . cetirizine (ZYRTEC) 10 MG tablet TAKE 1 TABLET BY MOUTH EVERY DAY  90 tablet  3  . folic acid (FOLVITE) 1 MG tablet Take 1 tablet (1 mg total) by mouth daily.  90 tablet  3  . methotrexate (RHEUMATREX) 2.5 MG tablet Take 2.5 mg by mouth 2 (two) times a week.        . rosuvastatin (CRESTOR) 40 MG tablet TAKE 1 TABLET BY MOUTH EVERY DAY  90 tablet  3  . traMADol (ULTRAM) 50 MG tablet Take 1 tablet (50 mg total) by mouth every 6 (six) hours as needed.  120 tablet  3   No current facility-administered medications on file prior to visit.   Review of Systems  Constitutional: Negative for unexpected weight change, or unusual diaphoresis  HENT: Negative for tinnitus.   Eyes: Negative for photophobia and visual disturbance.  Respiratory: Negative for choking and stridor.   Gastrointestinal: Negative for vomiting and blood in stool.  Genitourinary: Negative for hematuria and decreased urine volume.  Musculoskeletal: Negative for acute joint swelling Skin: Negative for color change and wound.  Neurological: Negative for tremors and numbness other than noted  Psychiatric/Behavioral: Negative for  decreased concentration or  hyperactivity.       Objective:   Physical Exam BP 108/62  Pulse 82  Temp(Src) 97.1 F (36.2 C) (Oral)  Wt 140 lb 6 oz (63.674 kg)  BMI 25.25 kg/m2  SpO2 96% VS noted, frail, walks with cane outside the home Constitutional: Pt appears well-developed and well-nourished/obese.  HENT: Head: NCAT.  Right Ear: External ear normal.  Left Ear: External ear normal.  Eyes: Conjunctivae and EOM are normal. Pupils are equal, round, and reactive to light.  Neck: Normal range of motion. Neck supple.  Cardiovascular: Normal rate and regular rhythm.   Pulmonary/Chest: Effort normal and breath sounds normal.  Abd:  Soft,  NT, non-distended, + BS Neurological: Pt is alert. Not confused  Skin: Skin is warm. No erythema.  Psychiatric: Pt behavior is normal. Thought content normal.     Assessment & Plan:

## 2013-01-04 NOTE — Assessment & Plan Note (Signed)
stable overall by history and exam, recent data reviewed with pt, and pt to continue medical treatment as before,  to f/u any worsening symptoms or concerns Lab Results  Component Value Date   LDLCALC 53 06/28/2012

## 2013-01-04 NOTE — Patient Instructions (Signed)
OK to stop the Klor con, HCTZ fluid pill, amlodipine and Lisinopril for now  Please continue to monitor your Blood Pressures at home, you goal is to be less than 140-150/90 or so  Please continue all other medications as before, and refills have been done if requested.  Please have the pharmacy call with any other refills you may need.

## 2013-01-04 NOTE — Assessment & Plan Note (Signed)
stable overall by history and exam, recent data reviewed with pt, and pt to continue medical treatment as before,  to f/u any worsening symptoms or concerns Lab Results  Component Value Date   HGBA1C 5.8 06/28/2012

## 2013-03-07 ENCOUNTER — Ambulatory Visit: Payer: Medicare Other | Admitting: Internal Medicine

## 2013-03-07 ENCOUNTER — Emergency Department (HOSPITAL_COMMUNITY): Payer: Medicare Other

## 2013-03-07 ENCOUNTER — Emergency Department (HOSPITAL_COMMUNITY)
Admission: EM | Admit: 2013-03-07 | Discharge: 2013-03-07 | Disposition: A | Discharge status: Home / Self Care | Payer: Medicare Other | Attending: Emergency Medicine | Admitting: Emergency Medicine

## 2013-03-07 ENCOUNTER — Encounter (HOSPITAL_COMMUNITY): Payer: Self-pay

## 2013-03-07 DIAGNOSIS — I10 Essential (primary) hypertension: Secondary | ICD-10-CM | POA: Insufficient documentation

## 2013-03-07 DIAGNOSIS — Y92009 Unspecified place in unspecified non-institutional (private) residence as the place of occurrence of the external cause: Secondary | ICD-10-CM | POA: Insufficient documentation

## 2013-03-07 DIAGNOSIS — R42 Dizziness and giddiness: Secondary | ICD-10-CM | POA: Insufficient documentation

## 2013-03-07 DIAGNOSIS — W19XXXA Unspecified fall, initial encounter: Secondary | ICD-10-CM

## 2013-03-07 DIAGNOSIS — M1710 Unilateral primary osteoarthritis, unspecified knee: Secondary | ICD-10-CM | POA: Insufficient documentation

## 2013-03-07 DIAGNOSIS — E785 Hyperlipidemia, unspecified: Secondary | ICD-10-CM | POA: Insufficient documentation

## 2013-03-07 DIAGNOSIS — K219 Gastro-esophageal reflux disease without esophagitis: Secondary | ICD-10-CM | POA: Insufficient documentation

## 2013-03-07 DIAGNOSIS — IMO0002 Reserved for concepts with insufficient information to code with codable children: Secondary | ICD-10-CM | POA: Insufficient documentation

## 2013-03-07 DIAGNOSIS — Z79899 Other long term (current) drug therapy: Secondary | ICD-10-CM | POA: Insufficient documentation

## 2013-03-07 DIAGNOSIS — Z87891 Personal history of nicotine dependence: Secondary | ICD-10-CM | POA: Insufficient documentation

## 2013-03-07 DIAGNOSIS — H409 Unspecified glaucoma: Secondary | ICD-10-CM | POA: Insufficient documentation

## 2013-03-07 DIAGNOSIS — W1809XA Striking against other object with subsequent fall, initial encounter: Secondary | ICD-10-CM | POA: Insufficient documentation

## 2013-03-07 DIAGNOSIS — Z8709 Personal history of other diseases of the respiratory system: Secondary | ICD-10-CM | POA: Insufficient documentation

## 2013-03-07 DIAGNOSIS — S2231XA Fracture of one rib, right side, initial encounter for closed fracture: Secondary | ICD-10-CM

## 2013-03-07 DIAGNOSIS — Y939 Activity, unspecified: Secondary | ICD-10-CM | POA: Insufficient documentation

## 2013-03-07 DIAGNOSIS — S2239XA Fracture of one rib, unspecified side, initial encounter for closed fracture: Secondary | ICD-10-CM | POA: Insufficient documentation

## 2013-03-07 DIAGNOSIS — M899 Disorder of bone, unspecified: Secondary | ICD-10-CM | POA: Insufficient documentation

## 2013-03-07 DIAGNOSIS — Z7982 Long term (current) use of aspirin: Secondary | ICD-10-CM | POA: Insufficient documentation

## 2013-03-07 LAB — BASIC METABOLIC PANEL
BUN: 14 mg/dL (ref 6–23)
Calcium: 9.3 mg/dL (ref 8.4–10.5)
GFR calc Af Amer: >90 mL/min (ref >90)
GFR calc non Af Amer: 81 mL/min — ABNORMAL LOW (ref >90)
Potassium: 3.5 mEq/L (ref 3.5–5.1)
Sodium: 134 mEq/L — ABNORMAL LOW (ref 135–145)

## 2013-03-07 LAB — CBC WITH DIFFERENTIAL/PLATELET
Basophils Relative: 1 % (ref 0–1)
Eosinophils Absolute: 0 10*3/uL (ref 0.0–0.7)
Eosinophils Relative: 0 % (ref 0–5)
MCH: 31.6 pg (ref 26.0–34.0)
MCHC: 33.5 g/dL (ref 30.0–36.0)
MCV: 94.3 fL (ref 78.0–100.0)
Neutrophils Relative %: 77 % (ref 43–77)
Platelets: 183 10*3/uL (ref 150–400)

## 2013-03-07 MED ORDER — HYDROCODONE-ACETAMINOPHEN 5-325 MG PO TABS
2.0000 | ORAL_TABLET | Freq: Once | ORAL | Status: AC
  Administered 2013-03-07: 2 via ORAL
  Filled 2013-03-07: qty 2

## 2013-03-07 MED ORDER — HYDROCODONE-ACETAMINOPHEN 5-325 MG PO TABS: 1.0000 | ORAL_TABLET | ORAL | Status: DC | PRN

## 2013-03-07 MED ORDER — HYDROCODONE-ACETAMINOPHEN 5-325 MG PO TABS
1.0000 | ORAL_TABLET | Freq: Once | ORAL | Status: DC
  Filled 2013-03-07: qty 1

## 2013-03-07 MED ORDER — HYDROCODONE-ACETAMINOPHEN 5-325 MG PO TABS: 2.0000 | ORAL_TABLET | Freq: Once | ORAL | Status: DC

## 2013-03-07 NOTE — ED Provider Notes (Signed)
CSN: [id number]     Arrival date & time 03/07/13  0913 History   First MD Initiated Contact with Patient 03/07/13 [id number]     Chief Complaint  Patient presents with  . Fall   (Consider location/radiation/quality/duration/timing/severity/associated sxs/prior Treatment) HPI Comments: 79 yo female with recurrent vertigo, blind right eye, arthritis presents with right flank pain after falling last evening.  Pt recalls feeling vertigo sxs similar to previous and then the next thing she knew she was on the floor with right flank pain, she was able to crawl to sit up.  No head injury or ha.  No cp or sob.  Pain with palpation and movement of right flank.  No vomiting or bleeding.  Improves with not moving.   Patient is a 79 y.o. female presenting with fall. The history is provided by the patient and a relative.  Fall This is a recurrent problem. Pertinent negatives include no chest pain, no abdominal pain, no headaches and no shortness of breath.    Past Medical History  Diagnosis Date  . ALLERGIC RHINITIS 05/16/2007  . ANKLE PAIN, BILATERAL 07/24/2010  . Diarrhea 02/24/2010  . FATIGUE 08/28/2007  . GERD 05/16/2007  . GLUCOSE INTOLERANCE 09/02/2008  . HYPERLIPIDEMIA 01/18/2007  . HYPERTENSION 01/18/2007  . KNEE PAIN, LEFT 07/10/2009  . LEG PAIN, LEFT 07/24/2010  . OSTEOARTHRITIS, KNEES, BILATERAL 05/16/2007  . OSTEOPENIA 09/26/2007  . Pain in joint, multiple sites 07/24/2010  . SHOULDER IMPINGEMENT SYNDROME, LEFT 05/16/2007  . SHOULDER PAIN, RIGHT 07/10/2009  . SINUSITIS- ACUTE-NOS 05/29/2007  . TRIGGER FINGER, RIGHT MIDDLE 07/10/2009  . Impaired glucose tolerance 02/16/2011  . Blind right eye   . Glaucoma, left eye    Past Surgical History  Procedure Laterality Date  . Inguinal herniorrhapy      x3  . S/p left knee arthroscopy  march 2011    Dr. Ranell Patrick   Family History  Problem Relation Age of Onset  . Dementia Mother   . Stroke Father   . Hypertension Other    History  Substance Use Topics   . Smoking status: Former Games developer  . Smokeless tobacco: Not on file  . Alcohol Use: Yes   OB History   Grav Para Term Preterm Abortions TAB SAB Ect Mult Living                 Review of Systems  Constitutional: Negative for fever and chills.  HENT: Negative for neck pain and neck stiffness.   Eyes: Negative for visual disturbance.  Respiratory: Negative for shortness of breath.   Cardiovascular: Negative for chest pain.  Gastrointestinal: Negative for vomiting and abdominal pain.  Genitourinary: Positive for flank pain. Negative for dysuria.  Musculoskeletal: Negative for back pain.  Skin: Negative for rash.  Neurological: Positive for dizziness. Negative for light-headedness, numbness and headaches.    Allergies  Oxaprozin  Home Medications   Current Outpatient Rx  Name  Route  Sig  Dispense  Refill  . aspirin 81 MG tablet   Oral   Take 81 mg by mouth daily.          . bimatoprost (LUMIGAN) 0.01 % SOLN   Both Eyes   Place 1 drop into both eyes at bedtime.          . cetirizine (ZYRTEC) 10 MG tablet      TAKE 1 TABLET BY MOUTH EVERY DAY   90 tablet   3   . Cholecalciferol (VITAMIN D) 2000 UNITS CAPS   Oral  Take 2,000 Units by mouth daily.         . folic acid (FOLVITE) 1 MG tablet   Oral   Take 1 tablet (1 mg total) by mouth daily.   90 tablet   3   . methotrexate (RHEUMATREX) 2.5 MG tablet   Oral   Take 2.5 mg by mouth 2 (two) times a week. Take 4 on fridays , and 3 on saturdays         . omeprazole (PRILOSEC) 20 MG capsule   Oral   Take 20 mg by mouth daily.         . rosuvastatin (CRESTOR) 40 MG tablet      TAKE 1 TABLET BY MOUTH EVERY DAY   90 tablet   3   . traMADol (ULTRAM) 50 MG tablet   Oral   Take 50 mg by mouth every 6 (six) hours as needed for pain.          BP 155/65  Pulse 80  Temp(Src) 98 F (36.7 C) (Oral)  Resp 13  Ht 5\' 1"  (1.549 m)  Wt 140 lb (63.504 kg)  BMI 26.47 kg/m2  SpO2 96% Physical Exam  Nursing  note and vitals reviewed. Constitutional: She is oriented to person, place, and time. She appears well-developed and well-nourished.  HENT:  Head: Normocephalic and atraumatic.  Eyes: Conjunctivae are normal. Right eye exhibits no discharge. Left eye exhibits no discharge.  Neck: Normal range of motion. Neck supple. No tracheal deviation present.  Cardiovascular: Normal rate and regular rhythm.   Pulmonary/Chest: Effort normal and breath sounds normal.  Abdominal: Soft. She exhibits no distension. There is no tenderness. There is no guarding.  Musculoskeletal: She exhibits tenderness (right lower rib lateral and posterior). She exhibits no edema.  No midline vertebral pain with palpation of cervical, thoracic and lumbar  Neurological: She is alert and oriented to person, place, and time. No sensory deficit. GCS eye subscore is 4. GCS verbal subscore is 5. GCS motor subscore is 6.  Right eye scarred, blind eomfi Gross vision with left eye intact to fingers No facial droop or arm drift Finger nose intact bilateral Moves all ext equal with appropriate strength for age  Skin: Skin is warm. No rash noted.  Psychiatric: She has a normal mood and affect.    ED Course  Procedures (including critical care time) Ultrasound limited abdominal and limited transthoracic ultrasound (FAST)  Indication: right flank pain, fall Four views were obtained using the low frequency transducer: Splenorenal, Hepatorenal, Retrovesical, Pericardial subxyphoid Interpretation: No free fluid visualized surrounding the kidneys, pelvis or pericardium. Images archived electronically Dr. Jodi Mourning personally performed and interpreted the images  Labs Review Labs Reviewed  CBC WITH DIFFERENTIAL - Abnormal; Notable for the following:    RBC 3.70 (*)    Hemoglobin 11.7 (*)    HCT 34.9 (*)    All other components within normal limits  BASIC METABOLIC PANEL - Abnormal; Notable for the following:    Sodium 134 (*)     Glucose, Bld 103 (*)    GFR calc non Af Amer 81 (*)    All other components within normal limits  TROPONIN I   Imaging Review No results found.  MDM  No diagnosis found. Concern for rib fracture. Pain medicines given . Lab work and ekg to look for other possible causes of vertigo/ syncope.  No blood thinners or head injury, neuro intact except chronic blindness, holding on CT head at this time.  Date:  03/07/2013  Rate: 75 Rhythm: normal sinus rhythm  QRS Axis: left  Intervals: QT prolonged  ST/T Wave abnormalities: nonspecific ST changes  Conduction Disutrbances:left anterior fascicular block  Narrative Interpretation:   Old EKG Reviewed: changes noted   Dg Chest 2 View  03/07/2013   CLINICAL DATA:  Weakness, right rib pain, fall.  EXAM: CHEST  2 VIEW  COMPARISON:  04/17/2010  FINDINGS: Bibasilar atelectasis. No effusions or pneumothorax. Heart is normal size.  There is a right lateral rib fracture involving the right 10th ribs. No additional acute bony abnormality.  IMPRESSION: Right lateral 10th rib fracture. Bibasilar atelectasis. No effusion or pneumothorax.   Electronically Signed   By: Charlett Nose M.D.   On: 03/07/2013 10:52  Pain controlled in ED. Improved on recheck.  Spirometer and close fup stressed, pt will call pcp tomorrow.  DC Family present to assist with close outpt fup.     Enid Skeens, MD 03/07/13 1141

## 2013-03-07 NOTE — ED Notes (Signed)
Dr. Jodi Mourning inquired about duplicate pain med. Order clarified & changed

## 2013-03-07 NOTE — ED Notes (Signed)
Pt. Fell yesterday onto her rt. Side She reports hit 2 door jams.  She denies hitting her head.  Only visible marks is bruising on her. Rt. Mid upper back area.  PT. Has a hx of vertigo and rt. Eye is blind.   Pt. Does not remember falling.  Pt. Is alert and oriented X4.   Pt. Is stable upon arrival to Pod D 33

## 2013-03-07 NOTE — ED Notes (Signed)
Reviewed incentive spirometer use with return pt demonstration. Pt refused additional pain pill

## 2013-03-09 ENCOUNTER — Ambulatory Visit (INDEPENDENT_AMBULATORY_CARE_PROVIDER_SITE_OTHER): Payer: Medicare Other | Admitting: Internal Medicine

## 2013-03-09 ENCOUNTER — Other Ambulatory Visit: Payer: Medicare Other

## 2013-03-09 ENCOUNTER — Encounter: Payer: Self-pay | Admitting: Internal Medicine

## 2013-03-09 VITALS — BP 140/80 | HR 98 | Temp 97.4°F | Ht 64.0 in | Wt 138.5 lb

## 2013-03-09 DIAGNOSIS — IMO0002 Reserved for concepts with insufficient information to code with codable children: Secondary | ICD-10-CM

## 2013-03-09 DIAGNOSIS — S2239XS Fracture of one rib, unspecified side, sequela: Secondary | ICD-10-CM

## 2013-03-09 DIAGNOSIS — W19XXXS Unspecified fall, sequela: Secondary | ICD-10-CM

## 2013-03-09 DIAGNOSIS — I10 Essential (primary) hypertension: Secondary | ICD-10-CM

## 2013-03-09 DIAGNOSIS — R42 Dizziness and giddiness: Secondary | ICD-10-CM

## 2013-03-09 DIAGNOSIS — Z23 Encounter for immunization: Secondary | ICD-10-CM

## 2013-03-09 MED ORDER — TRAMADOL HCL 50 MG PO TABS: 50.0000 mg | ORAL_TABLET | Freq: Four times a day (QID) | ORAL | Status: DC | PRN

## 2013-03-09 MED ORDER — MECLIZINE HCL 12.5 MG PO TABS: 12.5000 mg | ORAL_TABLET | Freq: Three times a day (TID) | ORAL | Status: AC | PRN

## 2013-03-09 NOTE — Progress Notes (Signed)
Subjective:    Patient ID: Jordan Henderson, female    DOB: February 15, 1931, 79 y.o.   MRN: [id number]  HPI  Here to f/u with son, unfort fell for unclear reason, unwitessed, pt unable to remember details, suffered rib fx left 10th after seen in ER, and also with c/o recurring vertigo.  Since then c/o CP but Pt denies increased sob or doe, wheezing, orthopnea, PND, increased LE swelling, palpitations, dizziness or syncope. Denies urinary symptoms such as dysuria, frequency, urgency, flank pain, hematuria or n/v, fever, chills.  Pt denies fever, night sweats, loss of appetite, or other constitutional symptoms Past Medical History  Diagnosis Date  . ALLERGIC RHINITIS 05/16/2007  . ANKLE PAIN, BILATERAL 07/24/2010  . Diarrhea 02/24/2010  . FATIGUE 08/28/2007  . GERD 05/16/2007  . GLUCOSE INTOLERANCE 09/02/2008  . HYPERLIPIDEMIA 01/18/2007  . HYPERTENSION 01/18/2007  . KNEE PAIN, LEFT 07/10/2009  . LEG PAIN, LEFT 07/24/2010  . OSTEOARTHRITIS, KNEES, BILATERAL 05/16/2007  . OSTEOPENIA 09/26/2007  . Pain in joint, multiple sites 07/24/2010  . SHOULDER IMPINGEMENT SYNDROME, LEFT 05/16/2007  . SHOULDER PAIN, RIGHT 07/10/2009  . SINUSITIS- ACUTE-NOS 05/29/2007  . TRIGGER FINGER, RIGHT MIDDLE 07/10/2009  . Impaired glucose tolerance 02/16/2011  . Blind right eye   . Glaucoma, left eye    Past Surgical History  Procedure Laterality Date  . Inguinal herniorrhapy      x3  . S/p left knee arthroscopy  march 2011    Dr. Ranell Patrick    reports that she has quit smoking. She does not have any smokeless tobacco history on file. She reports that  drinks alcohol. She reports that she does not use illicit drugs. family history includes Dementia in her mother; Hypertension in her other; Stroke in her father. Allergies  Allergen Reactions  . Oxaprozin     REACTION: rash   Current Outpatient Prescriptions on File Prior to Visit  Medication Sig Dispense Refill  . aspirin 81 MG tablet Take 81 mg by mouth daily.       .  bimatoprost (LUMIGAN) 0.01 % SOLN Place 1 drop into both eyes at bedtime.       . cetirizine (ZYRTEC) 10 MG tablet TAKE 1 TABLET BY MOUTH EVERY DAY  90 tablet  3  . Cholecalciferol (VITAMIN D) 2000 UNITS CAPS Take 2,000 Units by mouth daily.      . folic acid (FOLVITE) 1 MG tablet Take 1 tablet (1 mg total) by mouth daily.  90 tablet  3  . HYDROcodone-acetaminophen (NORCO) 5-325 MG per tablet Take 1-2 tablets by mouth every 4 (four) hours as needed for pain.  15 tablet  0  . methotrexate (RHEUMATREX) 2.5 MG tablet Take 2.5 mg by mouth 2 (two) times a week. Take 4 on fridays , and 3 on saturdays      . omeprazole (PRILOSEC) 20 MG capsule Take 20 mg by mouth daily.      . rosuvastatin (CRESTOR) 40 MG tablet TAKE 1 TABLET BY MOUTH EVERY DAY  90 tablet  3   No current facility-administered medications on file prior to visit.    Review of Systems  Constitutional: Negative for unexpected weight change, or unusual diaphoresis  HENT: Negative for tinnitus.   Eyes: Negative for photophobia and visual disturbance.  Respiratory: Negative for choking and stridor.   Gastrointestinal: Negative for vomiting and blood in stool.  Genitourinary: Negative for hematuria and decreased urine volume.  Musculoskeletal: Negative for acute joint swelling Skin: Negative for color change and wound.  Neurological: Negative for tremors and numbness other than noted  Psychiatric/Behavioral: Negative for decreased concentration or  hyperactivity.       Objective:   Physical Exam BP 140/80  Pulse 98  Temp(Src) 97.4 F (36.3 C) (Oral)  Ht 5\' 4"  (1.626 m)  Wt 138 lb 8 oz (62.823 kg)  BMI 23.76 kg/m2  SpO2 95% VS noted,  Constitutional: Pt appears well-developed and well-nourished.  HENT: Head: NCAT.  Right Ear: External ear normal.  Left Ear: External ear normal.  Eyes: Conjunctivae and EOM are normal. Pupils are equal, round, and reactive to light.  Neck: Normal range of motion. Neck supple.  Cardiovascular:  Normal rate and regular rhythm.   Pulmonary/Chest: Effort normal and breath sounds normal.  Abd:  Soft, NT, non-distended, + BS, mild flank tedne Neurological: Pt is alert, has baseline confusion  Skin: Skin is warm. No erythema.  Psychiatric: Pt behavior is normal      Assessment & Plan:

## 2013-03-09 NOTE — Patient Instructions (Addendum)
You had the flu shot today Please take all new medication as prescribed - the meclizine as needed for vertigo, tramadol refill for the pain Please go to the LAB in the Basement (turn left off the elevator) for the tests to be done today - the urine testing only  You will be contacted by phone if any changes need to be made immediately.  Otherwise, you will receive a letter about your results with an explanation, but please check with MyChart first.  Please remember to sign up for My Chart if you have not done so, as this will be important to you in the future with finding out test results, communicating by private email, and scheduling acute appointments online when needed.  Please return in 6 months, or sooner if needed

## 2013-03-10 DIAGNOSIS — W19XXXA Unspecified fall, initial encounter: Secondary | ICD-10-CM | POA: Insufficient documentation

## 2013-03-10 DIAGNOSIS — S2239XA Fracture of one rib, unspecified side, initial encounter for closed fracture: Secondary | ICD-10-CM | POA: Insufficient documentation

## 2013-03-10 DIAGNOSIS — R296 Repeated falls: Secondary | ICD-10-CM | POA: Insufficient documentation

## 2013-03-10 DIAGNOSIS — R42 Dizziness and giddiness: Secondary | ICD-10-CM | POA: Insufficient documentation

## 2013-03-10 NOTE — Assessment & Plan Note (Signed)
For meclizine prn,  to f/u any worsening symptoms or concerns 

## 2013-03-10 NOTE — Assessment & Plan Note (Signed)
With pain, for tramadol prn,  to f/u any worsening symptoms or concerns

## 2013-03-10 NOTE — Assessment & Plan Note (Signed)
stable overall by history and exam, recent data reviewed with pt, and pt to continue medical treatment as before,  to f/u any worsening symptoms or concerns BP Readings from Last 3 Encounters:  03/09/13 140/80  03/07/13 142/69  01/04/13 108/62

## 2013-03-10 NOTE — Assessment & Plan Note (Signed)
unclear etiology, for UA

## 2013-05-03 ENCOUNTER — Telehealth: Payer: Self-pay | Admitting: Internal Medicine

## 2013-05-03 NOTE — Telephone Encounter (Signed)
Called the patients daughter informed of MD instructions.  The daughter did schedule appointment with Dr. Jonny Ruiz.

## 2013-05-03 NOTE — Telephone Encounter (Signed)
05/03/2013   Pt's daughter, Michaele Offer, called in with concerns of mother possibly having dementia; pt is having conversations from the past, losing memory.  Diane would like advice; does not want to upset pt with office visit.  Please advise

## 2013-05-03 NOTE — Telephone Encounter (Signed)
Unfort it is hard to assess from just that information;  Other things like UTI can even cause confusion; we should see in the office if possible, and maybe consider a neurology referral if needed;  We can work around the word "dementia" and not really say that word to the patient

## 2013-05-04 ENCOUNTER — Ambulatory Visit (INDEPENDENT_AMBULATORY_CARE_PROVIDER_SITE_OTHER)
Admission: RE | Admit: 2013-05-04 | Discharge: 2013-05-04 | Disposition: A | Discharge status: Home / Self Care | Payer: Medicare Other | Source: Ambulatory Visit | Attending: Internal Medicine | Admitting: Internal Medicine

## 2013-05-04 ENCOUNTER — Telehealth: Payer: Self-pay | Admitting: Internal Medicine

## 2013-05-04 ENCOUNTER — Encounter: Payer: Self-pay | Admitting: Internal Medicine

## 2013-05-04 ENCOUNTER — Ambulatory Visit (INDEPENDENT_AMBULATORY_CARE_PROVIDER_SITE_OTHER): Payer: Medicare Other | Admitting: Internal Medicine

## 2013-05-04 ENCOUNTER — Other Ambulatory Visit (INDEPENDENT_AMBULATORY_CARE_PROVIDER_SITE_OTHER): Payer: Medicare Other

## 2013-05-04 VITALS — BP 132/86 | HR 95 | Temp 97.0°F | Wt 132.0 lb

## 2013-05-04 DIAGNOSIS — E785 Hyperlipidemia, unspecified: Secondary | ICD-10-CM

## 2013-05-04 DIAGNOSIS — R42 Dizziness and giddiness: Secondary | ICD-10-CM | POA: Insufficient documentation

## 2013-05-04 DIAGNOSIS — N390 Urinary tract infection, site not specified: Secondary | ICD-10-CM

## 2013-05-04 DIAGNOSIS — M069 Rheumatoid arthritis, unspecified: Secondary | ICD-10-CM | POA: Insufficient documentation

## 2013-05-04 DIAGNOSIS — J90 Pleural effusion, not elsewhere classified: Secondary | ICD-10-CM

## 2013-05-04 DIAGNOSIS — F329 Major depressive disorder, single episode, unspecified: Secondary | ICD-10-CM

## 2013-05-04 DIAGNOSIS — S2231XS Fracture of one rib, right side, sequela: Secondary | ICD-10-CM

## 2013-05-04 LAB — POCT URINALYSIS DIPSTICK
Blood, UA: NEGATIVE
Ketones, UA: NEGATIVE
Protein, UA: NEGATIVE
Spec Grav, UA: 1.015
pH, UA: 6

## 2013-05-04 LAB — BASIC METABOLIC PANEL
CO2: 27 mEq/L (ref 19–32)
Calcium: 9 mg/dL (ref 8.4–10.5)
Chloride: 102 mEq/L (ref 96–112)
Creatinine, Ser: 0.8 mg/dL (ref 0.4–1.2)
Glucose, Bld: 109 mg/dL — ABNORMAL HIGH (ref 70–99)

## 2013-05-04 LAB — HEPATIC FUNCTION PANEL
ALT: 13 U/L (ref 0–35)
Albumin: 3.3 g/dL — ABNORMAL LOW (ref 3.5–5.2)
Total Protein: 7 g/dL (ref 6.0–8.3)

## 2013-05-04 LAB — CBC WITH DIFFERENTIAL/PLATELET
Basophils Absolute: 0.2 10*3/uL — ABNORMAL HIGH (ref 0.0–0.1)
Basophils Relative: 1.7 % (ref 0.0–3.0)
Eosinophils Absolute: 0.3 10*3/uL (ref 0.0–0.7)
Hemoglobin: 12.5 g/dL (ref 12.0–15.0)
Lymphocytes Relative: 11.7 % — ABNORMAL LOW (ref 12.0–46.0)
Monocytes Relative: 8.9 % (ref 3.0–12.0)
Neutro Abs: 7.7 10*3/uL (ref 1.4–7.7)
Neutrophils Relative %: 74.5 % (ref 43.0–77.0)
RBC: 4.17 Mil/uL (ref 3.87–5.11)
RDW: 14.9 % — ABNORMAL HIGH (ref 11.5–14.6)

## 2013-05-04 LAB — URINALYSIS, ROUTINE W REFLEX MICROSCOPIC
Nitrite: NEGATIVE
Specific Gravity, Urine: >=1.03 (ref 1.000–1.030)
Urine Glucose: NEGATIVE
Urobilinogen, UA: 2 (ref 0.0–1.0)

## 2013-05-04 MED ORDER — CITALOPRAM HYDROBROMIDE 10 MG PO TABS: 10.0000 mg | ORAL_TABLET | Freq: Every day | ORAL | Status: DC

## 2013-05-04 NOTE — Progress Notes (Signed)
Subjective:    Patient ID: Jordan Henderson, female    DOB: [birth year], 79 y.o.   MRN: [id number]  HPI  Here to f/u with family concerned about pt just not acting herself, with increased confusion, general weakness, fatigue, depressed mood and freq crying spells, hallucinations visual, dizziness, recent constipation, and right lower back pain - Pt has right lower recurring LBP , no other bowel or bladder change, fever,  worsening LE pain/numbness/weakness, gait change or falls except  Wt loss 138 to 132.  Has had fall with rib fx approx 8 wks ago. Pt denies chest pain, increased sob or doe, wheezing, orthopnea, PND, increased LE swelling, palpitations, dizziness or syncope.Pt denies new neurological symptoms such as new headache, or facial or extremity weakness or numbness.  Pt denies polydipsia, polyuria, Past Medical History  Diagnosis Date  . ALLERGIC RHINITIS 05/16/2007  . ANKLE PAIN, BILATERAL 07/24/2010  . Diarrhea 02/24/2010  . FATIGUE 08/28/2007  . GERD 05/16/2007  . GLUCOSE INTOLERANCE 09/02/2008  . HYPERLIPIDEMIA 01/18/2007  . HYPERTENSION 01/18/2007  . KNEE PAIN, LEFT 07/10/2009  . LEG PAIN, LEFT 07/24/2010  . OSTEOARTHRITIS, KNEES, BILATERAL 05/16/2007  . OSTEOPENIA 09/26/2007  . Pain in joint, multiple sites 07/24/2010  . SHOULDER IMPINGEMENT SYNDROME, LEFT 05/16/2007  . SHOULDER PAIN, RIGHT 07/10/2009  . SINUSITIS- ACUTE-NOS 05/29/2007  . TRIGGER FINGER, RIGHT MIDDLE 07/10/2009  . Impaired glucose tolerance 02/16/2011  . Blind right eye   . Glaucoma, left eye    Past Surgical History  Procedure Laterality Date  . Inguinal herniorrhapy      x3  . S/p left knee arthroscopy  march 2011    Dr. Ranell Patrick    reports that she has quit smoking. She does not have any smokeless tobacco history on file. She reports that she drinks alcohol. She reports that she does not use illicit drugs. family history includes Dementia in her mother; Hypertension in her other; Stroke in her father. Allergies   Allergen Reactions  . Oxaprozin     REACTION: rash   Current Outpatient Prescriptions on File Prior to Visit  Medication Sig Dispense Refill  . aspirin 81 MG tablet Take 81 mg by mouth daily.       . bimatoprost (LUMIGAN) 0.01 % SOLN Place 1 drop into both eyes at bedtime.       . Cholecalciferol (VITAMIN D) 2000 UNITS CAPS Take 2,000 Units by mouth daily.      . folic acid (FOLVITE) 1 MG tablet Take 1 tablet (1 mg total) by mouth daily.  90 tablet  3  . methotrexate (RHEUMATREX) 2.5 MG tablet Take 2.5 mg by mouth 2 (two) times a week. Take 4 on fridays , and 3 on saturdays      . omeprazole (PRILOSEC) 20 MG capsule Take 20 mg by mouth daily.      . meclizine (ANTIVERT) 12.5 MG tablet Take 1 tablet (12.5 mg total) by mouth 3 (three) times daily as needed.  30 tablet  1   No current facility-administered medications on file prior to visit.    Review of Systems All otherwise neg per pt     Objective:   Physical Exam BP 132/86  Pulse 95  Temp(Src) 97 F (36.1 C) (Oral)  Wt 132 lb (59.875 kg)  SpO2 95% VS noted, orthostatic BP noted Constitutional: Pt appears to have lost several lbs since last seen, general weak  HENT: Head: NCAT.  Right Ear: External ear normal.  Left Ear: External ear normal.  Eyes: Conjunctivae and EOM are normal. Pupils are equal, round, and reactive to light.  Neck: Normal range of motion. Neck supple.  Cardiovascular: Normal rate and regular rhythm.   Pulmonary/Chest: Effort normal and breath sounds . decreased bases, no rales or wheezing Abd:  Soft, NT, non-distended, + BS Neurological: Pt is alertt + mild confused  Skin: Skin is warm. No erythema.  Psychiatric: Pt behavior is normal.     Assessment & Plan:

## 2013-05-04 NOTE — Telephone Encounter (Signed)
Please come to 3 or 315 appt today as we now have open appt

## 2013-05-04 NOTE — Progress Notes (Signed)
Pre-visit discussion using our clinic review tool. No additional management support is needed unless otherwise documented below in the visit note.  

## 2013-05-04 NOTE — Telephone Encounter (Signed)
Patient is on schedule today 05/04/13.

## 2013-05-04 NOTE — Patient Instructions (Addendum)
Please take all new medication as prescribed - the citalopram 10 mg per day Your EKG was OK today Your  Blood Pressure fell with standing today but only a little bit Please drink more fluids over the next few days OK to take salt in your diet more in the next few days Please go to the XRAY Department in the Basement (go straight as you get off the elevator) for the x-ray testing Please go to the LAB in the Basement (turn left off the elevator) for the tests to be done today You will be contacted by phone if any changes need to be made immediately.  Otherwise, you will receive a letter about your results with an explanation, but please check with MyChart first.  Ok to use Colace 100 mg twice per day as needed (stool softner) and dulcolox as needed OK to stop the cholesterol pill  If your symtpoms become worse, you may need to consider going to the ER for evaluation and treatment, such as IV fluids  Please return in 4 weeks, or sooner if needed, with Lab testing done 3-5 days before

## 2013-05-04 NOTE — Assessment & Plan Note (Addendum)
ECG reviewed as per emr, etiology uncleaer, also for cxr Note:  Total time for pt hx, exam, review of record with pt in the room, determination of diagnoses and plan for further eval and tx is > 40 min, with over 50% spent in coordination and counseling of patient

## 2013-05-04 NOTE — Telephone Encounter (Signed)
Spoke to daughter Michaele Offer on pt cell phone ( but Sedalia Muta has her own phone:  [phone])  Lab work not yet available, but has CXR with large right effusion - ? Hemothorax related to rib fx 8 wk ago;  I suspect subacute/chronic, daughter now relates pt seems with DOE  Will refer urgent to [facility] for further eval and tx

## 2013-05-04 NOTE — Telephone Encounter (Signed)
05/04/2013  Pt's daughter, Sedalia Muta, called in to state that pt may have a UTI.  Pt has appt on 05/09/2013 for memory loss/UTI.  Diane wants to know if she can come by and get urine sample cup and then take it to lab to have it tested so possible UTI can be treated sooner than 05/09/2013 appt.  Please advise.  Dianes # [phone]

## 2013-05-05 DIAGNOSIS — F329 Major depressive disorder, single episode, unspecified: Secondary | ICD-10-CM | POA: Insufficient documentation

## 2013-05-05 NOTE — Assessment & Plan Note (Signed)
For citalopram 10 qd 

## 2013-05-05 NOTE — Assessment & Plan Note (Signed)
stable overall by history and exam, recent data reviewed with pt, and pt to continue medical treatment as before,  to f/u any worsening symptoms or concerns Lab Results  Component Value Date   LDLCALC 53 06/28/2012   For labs today

## 2013-05-05 NOTE — Assessment & Plan Note (Signed)
No synovitis, for sed rate

## 2013-05-07 ENCOUNTER — Encounter: Payer: Self-pay | Admitting: Internal Medicine

## 2013-05-08 ENCOUNTER — Telehealth: Payer: Self-pay | Admitting: *Deleted

## 2013-05-08 NOTE — Telephone Encounter (Signed)
Ok for now, though i dont think the medication is causing this; we can try again later if needed

## 2013-05-08 NOTE — Telephone Encounter (Signed)
Diane called states pt is experiencing dizziness and decreased appetite pt is worsening since OV on Friday.  Diane has decided to d/c the antidepressant due to the increased dizziness.  When do they need to consider taking pt to the ER.  Please advise

## 2013-05-08 NOTE — Telephone Encounter (Signed)
Called informed the Jordan Henderson of MD instructions.

## 2013-05-09 ENCOUNTER — Telehealth: Payer: Self-pay

## 2013-05-09 ENCOUNTER — Ambulatory Visit: Payer: Medicare Other | Admitting: Internal Medicine

## 2013-05-09 DIAGNOSIS — R8290 Unspecified abnormal findings in urine: Secondary | ICD-10-CM

## 2013-05-09 NOTE — Telephone Encounter (Signed)
?   Vaginitis - does pt have a GYN she sees?

## 2013-05-09 NOTE — Telephone Encounter (Signed)
Called the daughter informed that urine test has been ordered for the patient.  Put order in for UA.

## 2013-05-09 NOTE — Telephone Encounter (Signed)
We can check another UA, But I would not start antibx unless there is evidence of infection, such as pain with urination, freq, urgency, abd pain, fever, chills  Ok for UA - abnormal urine odor - to robin to order

## 2013-05-09 NOTE — Telephone Encounter (Signed)
The daughter called to inform the patients urine this morning had a very strong odor.  Stated she was aware urine test was negative, but could she have infection as odor is so bad.  There is no one available to bring her in today as daughter is out of town.  If ok to send in antibiotic send to [facility].  Call back number to Sedalia Muta is [phone]

## 2013-05-09 NOTE — Telephone Encounter (Signed)
The daughter informed she does not have a gyn and has not seen one in years. States the odor is so bad that her brother mentioned it as well.

## 2013-05-10 ENCOUNTER — Inpatient Hospital Stay (HOSPITAL_COMMUNITY): Payer: Medicare Other

## 2013-05-10 ENCOUNTER — Emergency Department (HOSPITAL_COMMUNITY): Payer: Medicare Other

## 2013-05-10 ENCOUNTER — Encounter (HOSPITAL_COMMUNITY): Payer: Self-pay | Admitting: Emergency Medicine

## 2013-05-10 ENCOUNTER — Inpatient Hospital Stay (HOSPITAL_COMMUNITY)
Admission: EM | Admit: 2013-05-10 | Discharge: 2013-05-14 | DRG: 187 | Disposition: A | Discharge status: Skilled Nursing Facility | Payer: Medicare Other | Attending: Internal Medicine | Admitting: Internal Medicine

## 2013-05-10 ENCOUNTER — Institutional Professional Consult (permissible substitution): Payer: Medicare Other | Admitting: Internal Medicine

## 2013-05-10 DIAGNOSIS — R42 Dizziness and giddiness: Secondary | ICD-10-CM

## 2013-05-10 DIAGNOSIS — D649 Anemia, unspecified: Secondary | ICD-10-CM

## 2013-05-10 DIAGNOSIS — K219 Gastro-esophageal reflux disease without esophagitis: Secondary | ICD-10-CM

## 2013-05-10 DIAGNOSIS — Z9181 History of falling: Secondary | ICD-10-CM

## 2013-05-10 DIAGNOSIS — I10 Essential (primary) hypertension: Secondary | ICD-10-CM | POA: Diagnosis present

## 2013-05-10 DIAGNOSIS — S2231XS Fracture of one rib, right side, sequela: Secondary | ICD-10-CM

## 2013-05-10 DIAGNOSIS — E871 Hypo-osmolality and hyponatremia: Secondary | ICD-10-CM | POA: Diagnosis present

## 2013-05-10 DIAGNOSIS — F3289 Other specified depressive episodes: Secondary | ICD-10-CM | POA: Diagnosis present

## 2013-05-10 DIAGNOSIS — F0390 Unspecified dementia without behavioral disturbance: Secondary | ICD-10-CM | POA: Diagnosis present

## 2013-05-10 DIAGNOSIS — J309 Allergic rhinitis, unspecified: Secondary | ICD-10-CM | POA: Diagnosis present

## 2013-05-10 DIAGNOSIS — R5381 Other malaise: Secondary | ICD-10-CM | POA: Diagnosis present

## 2013-05-10 DIAGNOSIS — Z87891 Personal history of nicotine dependence: Secondary | ICD-10-CM

## 2013-05-10 DIAGNOSIS — F329 Major depressive disorder, single episode, unspecified: Secondary | ICD-10-CM

## 2013-05-10 DIAGNOSIS — J90 Pleural effusion, not elsewhere classified: Principal | ICD-10-CM | POA: Diagnosis present

## 2013-05-10 DIAGNOSIS — E440 Moderate protein-calorie malnutrition: Secondary | ICD-10-CM | POA: Diagnosis present

## 2013-05-10 DIAGNOSIS — M6530 Trigger finger, unspecified finger: Secondary | ICD-10-CM

## 2013-05-10 DIAGNOSIS — W19XXXS Unspecified fall, sequela: Secondary | ICD-10-CM

## 2013-05-10 DIAGNOSIS — M064 Inflammatory polyarthropathy: Secondary | ICD-10-CM

## 2013-05-10 DIAGNOSIS — IMO0001 Reserved for inherently not codable concepts without codable children: Secondary | ICD-10-CM

## 2013-05-10 DIAGNOSIS — E876 Hypokalemia: Secondary | ICD-10-CM

## 2013-05-10 DIAGNOSIS — R231 Pallor: Secondary | ICD-10-CM | POA: Diagnosis present

## 2013-05-10 DIAGNOSIS — W19XXXD Unspecified fall, subsequent encounter: Secondary | ICD-10-CM

## 2013-05-10 DIAGNOSIS — IMO0002 Reserved for concepts with insufficient information to code with codable children: Secondary | ICD-10-CM

## 2013-05-10 DIAGNOSIS — S2239XA Fracture of one rib, unspecified side, initial encounter for closed fracture: Secondary | ICD-10-CM

## 2013-05-10 DIAGNOSIS — R0602 Shortness of breath: Secondary | ICD-10-CM | POA: Diagnosis present

## 2013-05-10 DIAGNOSIS — M899 Disorder of bone, unspecified: Secondary | ICD-10-CM

## 2013-05-10 DIAGNOSIS — D72829 Elevated white blood cell count, unspecified: Secondary | ICD-10-CM

## 2013-05-10 DIAGNOSIS — R7302 Impaired glucose tolerance (oral): Secondary | ICD-10-CM

## 2013-05-10 DIAGNOSIS — M069 Rheumatoid arthritis, unspecified: Secondary | ICD-10-CM | POA: Diagnosis present

## 2013-05-10 DIAGNOSIS — H409 Unspecified glaucoma: Secondary | ICD-10-CM

## 2013-05-10 DIAGNOSIS — E785 Hyperlipidemia, unspecified: Secondary | ICD-10-CM | POA: Diagnosis present

## 2013-05-10 DIAGNOSIS — W19XXXA Unspecified fall, initial encounter: Secondary | ICD-10-CM

## 2013-05-10 DIAGNOSIS — J942 Hemothorax: Secondary | ICD-10-CM

## 2013-05-10 DIAGNOSIS — H5440 Blindness, one eye, unspecified eye: Secondary | ICD-10-CM

## 2013-05-10 DIAGNOSIS — Z0000 Encounter for general adult medical examination without abnormal findings: Secondary | ICD-10-CM

## 2013-05-10 DIAGNOSIS — Z66 Do not resuscitate: Secondary | ICD-10-CM | POA: Diagnosis present

## 2013-05-10 LAB — CBC WITH DIFFERENTIAL/PLATELET
Eosinophils Absolute: 0.1 10*3/uL (ref 0.0–0.7)
Eosinophils Relative: 1 % (ref 0–5)
HCT: 34.5 % — ABNORMAL LOW (ref 36.0–46.0)
Hemoglobin: 11.6 g/dL — ABNORMAL LOW (ref 12.0–15.0)
Lymphocytes Relative: 7 % — ABNORMAL LOW (ref 12–46)
Lymphs Abs: 0.8 10*3/uL (ref 0.7–4.0)
MCH: 30.4 pg (ref 26.0–34.0)
MCV: 90.6 fL (ref 78.0–100.0)
Monocytes Absolute: 1 10*3/uL (ref 0.1–1.0)
Monocytes Relative: 9 % (ref 3–12)
RBC: 3.81 MIL/uL — ABNORMAL LOW (ref 3.87–5.11)
WBC: 10.9 10*3/uL — ABNORMAL HIGH (ref 4.0–10.5)

## 2013-05-10 LAB — BASIC METABOLIC PANEL
BUN: 14 mg/dL (ref 6–23)
CO2: 25 mEq/L (ref 19–32)
Calcium: 9 mg/dL (ref 8.4–10.5)
Creatinine, Ser: 0.61 mg/dL (ref 0.50–1.10)
GFR calc non Af Amer: 83 mL/min — ABNORMAL LOW (ref >90)
Glucose, Bld: 116 mg/dL — ABNORMAL HIGH (ref 70–99)
Sodium: 134 mEq/L — ABNORMAL LOW (ref 135–145)

## 2013-05-10 LAB — CBC
Hemoglobin: 11.2 g/dL — ABNORMAL LOW (ref 12.0–15.0)
MCH: 30.3 pg (ref 26.0–34.0)
MCHC: 33.1 g/dL (ref 30.0–36.0)
MCHC: 33.3 g/dL (ref 30.0–36.0)
MCV: 90.8 fL (ref 78.0–100.0)
Platelets: 278 10*3/uL (ref 150–400)
RBC: 3.69 MIL/uL — ABNORMAL LOW (ref 3.87–5.11)
RDW: 14 % (ref 11.5–15.5)
RDW: 14 % (ref 11.5–15.5)
WBC: 8.7 10*3/uL (ref 4.0–10.5)

## 2013-05-10 LAB — URINALYSIS, ROUTINE W REFLEX MICROSCOPIC
Glucose, UA: NEGATIVE mg/dL
Hgb urine dipstick: NEGATIVE
Protein, ur: 30 mg/dL — AB

## 2013-05-10 LAB — TYPE AND SCREEN
ABO/RH(D): B POS
Antibody Screen: NEGATIVE

## 2013-05-10 LAB — BODY FLUID CELL COUNT WITH DIFFERENTIAL
Lymphs, Fluid: 73 %
Total Nucleated Cell Count, Fluid: 73 cu mm (ref 0–1000)

## 2013-05-10 LAB — URINE MICROSCOPIC-ADD ON

## 2013-05-10 LAB — LACTATE DEHYDROGENASE: LDH: 269 U/L — ABNORMAL HIGH (ref 94–250)

## 2013-05-10 LAB — TROPONIN I: Troponin I: <0.3 ng/mL (ref <0.30)

## 2013-05-10 LAB — PROTIME-INR: Prothrombin Time: 15.4 seconds — ABNORMAL HIGH (ref 11.6–15.2)

## 2013-05-10 LAB — PROTEIN, BODY FLUID

## 2013-05-10 LAB — GLUCOSE, SEROUS FLUID: Glucose, Fluid: 87 mg/dL

## 2013-05-10 LAB — APTT: aPTT: 34 seconds (ref 24–37)

## 2013-05-10 MED ORDER — PANTOPRAZOLE SODIUM 40 MG PO TBEC
40.0000 mg | DELAYED_RELEASE_TABLET | Freq: Every day | ORAL | Status: DC
  Administered 2013-05-10 – 2013-05-14 (×5): 40 mg via ORAL
  Filled 2013-05-10 (×5): qty 1

## 2013-05-10 MED ORDER — POTASSIUM CHLORIDE CRYS ER 20 MEQ PO TBCR
40.0000 meq | EXTENDED_RELEASE_TABLET | Freq: Once | ORAL | Status: AC
  Administered 2013-05-10: 40 meq via ORAL
  Filled 2013-05-10: qty 2

## 2013-05-10 MED ORDER — LORATADINE 10 MG PO TABS
10.0000 mg | ORAL_TABLET | Freq: Every day | ORAL | Status: DC
  Administered 2013-05-10 – 2013-05-14 (×5): 10 mg via ORAL
  Filled 2013-05-10 (×5): qty 1

## 2013-05-10 MED ORDER — ENSURE COMPLETE PO LIQD
237.0000 mL | Freq: Two times a day (BID) | ORAL | Status: DC
  Administered 2013-05-11 – 2013-05-14 (×4): 237 mL via ORAL

## 2013-05-10 MED ORDER — VITAMIN D 1000 UNITS PO TABS
2000.0000 [IU] | ORAL_TABLET | Freq: Every day | ORAL | Status: DC
  Administered 2013-05-10 – 2013-05-14 (×5): 2000 [IU] via ORAL
  Filled 2013-05-10 (×5): qty 2

## 2013-05-10 MED ORDER — LATANOPROST 0.005 % OP SOLN
1.0000 [drp] | Freq: Every day | OPHTHALMIC | Status: DC
  Administered 2013-05-11 – 2013-05-13 (×3): 1 [drp] via OPHTHALMIC
  Filled 2013-05-10 (×2): qty 2.5

## 2013-05-10 MED ORDER — FOLIC ACID 1 MG PO TABS
1.0000 mg | ORAL_TABLET | Freq: Every day | ORAL | Status: DC
  Administered 2013-05-10 – 2013-05-14 (×5): 1 mg via ORAL
  Filled 2013-05-10 (×5): qty 1

## 2013-05-10 MED ORDER — MECLIZINE HCL 12.5 MG PO TABS
12.5000 mg | ORAL_TABLET | Freq: Three times a day (TID) | ORAL | Status: DC | PRN
  Administered 2013-05-10: 21:00:00 12.5 mg via ORAL
  Filled 2013-05-10: qty 1

## 2013-05-10 MED ORDER — SODIUM CHLORIDE 0.9 % IV SOLN
INTRAVENOUS | Status: AC
  Administered 2013-05-10: 20:00:00 via INTRAVENOUS

## 2013-05-10 MED ORDER — METHOTREXATE 2.5 MG PO TABS: 10.0000 mg | ORAL_TABLET | ORAL | Status: DC

## 2013-05-10 MED ORDER — CEFTRIAXONE SODIUM 1 G IJ SOLR: 1.0000 g | Freq: Once | INTRAMUSCULAR | Status: DC

## 2013-05-10 MED ORDER — SODIUM CHLORIDE 0.9 % IJ SOLN
3.0000 mL | Freq: Two times a day (BID) | INTRAMUSCULAR | Status: DC
  Administered 2013-05-11 – 2013-05-13 (×3): 3 mL via INTRAVENOUS

## 2013-05-10 MED ORDER — DOCUSATE SODIUM 100 MG PO CAPS
100.0000 mg | ORAL_CAPSULE | Freq: Two times a day (BID) | ORAL | Status: DC
Start: 2013-05-10 — End: 2013-05-14
  Administered 2013-05-10 – 2013-05-14 (×6): 100 mg via ORAL
  Filled 2013-05-10 (×9): qty 1

## 2013-05-10 MED ORDER — SODIUM CHLORIDE 0.9 % IV BOLUS (SEPSIS)
1000.0000 mL | Freq: Once | INTRAVENOUS | Status: AC
  Administered 2013-05-10: 1000 mL via INTRAVENOUS

## 2013-05-10 MED ORDER — SENNA 8.6 MG PO TABS
1.0000 | ORAL_TABLET | Freq: Two times a day (BID) | ORAL | Status: DC
  Administered 2013-05-11 – 2013-05-13 (×4): 8.6 mg via ORAL
  Filled 2013-05-10 (×5): qty 1

## 2013-05-10 NOTE — Procedures (Signed)
US guided diagnostic/therapeutic right thoracentesis performed yielding 1.5 liters bloody fluid. A portion of the fluid was sent to the lab for preordered studies. F/u CXR pending. Dr. Malachi Bonds was notified of the above findings.

## 2013-05-10 NOTE — ED Notes (Signed)
Pt went for thoracentesis.

## 2013-05-10 NOTE — Progress Notes (Signed)
EDCM spoke to patient and family at bedside.  Patient lives alone and currently has 24/7 care by [employer].  Patient's family not interested in home health at this time.  They are looking for [facility] placement.  EDCM placed consult into Child psychotherapist.  No further CM needs at this time.

## 2013-05-10 NOTE — ED Notes (Signed)
Bed: ZO10 Expected date:  Expected time:  Means of arrival:  Comments: [place]

## 2013-05-10 NOTE — ED Notes (Signed)
Radiology called stating that they are taking pt directly to floor.

## 2013-05-10 NOTE — ED Notes (Signed)
Pt's son came and got pt's belonging bag.

## 2013-05-10 NOTE — Progress Notes (Signed)
Utilization Review completed.  Katrice Goel RN CM  

## 2013-05-10 NOTE — ED Notes (Signed)
Patient transported to CT 

## 2013-05-10 NOTE — Progress Notes (Signed)
Discussed patient with [employer] [facility] [employer].

## 2013-05-10 NOTE — ED Notes (Signed)
Patient transported to X-ray 

## 2013-05-10 NOTE — ED Notes (Signed)
Pt placed on bedpan. Unable to void at this time.

## 2013-05-10 NOTE — H&P (Signed)
[facility] History and Physical  BERNISHA VERMA YNW:[id number] DOB: 08-12-1930 DOA: 05/10/2013  Referring physician:  Dr. Gardiner Rhyme PCP:  Oliver Barre, MD   Chief Complaint:  Shortness of breath with cough, progressive weakness, memory difficulties  HPI:  The patient is a 79 y.o. year-old female with history of diet controlled HTN and HLD, GERD, rheumatoid arthritis on immunosuppression who presents with progressive SOB, cough, weakness, and memory problems.  The patient was last at their baseline health several months ago.  About two months ago, she had a fall and broke several ribs on the right chest.  Her diagnostic CXR demonstrated no pleural effusion.  About one month ago, she had another fall and injured her right shoulder.  Since that time, she has had progressive weakness.  As of 4 months ago, she was living independently, however, over the last couple of weaks, she has needed assistance to get out of bed and get to the bathroom.  She has been coughing frequently and over the last three weeks, she has been Madolin Twaddle of breath with minimal exertion.  Additionally, her family has noticed that she has had increases Abiel Antrim term memory problems.  For example, sometimes, she thinks she is at a hotel even though she is in a house she has known for 30 years.  A week ago, she was seen by her PCP, who repeated her CXR which demonstrated a large right pleural effusion (05/04/2013) and she was referred to pulmonology.  She actually had an appointment scheduled for today, but due to her progressive SOB, her family brought her to the ER.  Of note, she smoked "but did not inhale" many years ago and there is no family history of lung cancer.  She was on aspirin, but stopped more than 3 months ago, even before her first fall, and has not been on any other blood thinners.    In the ER, her VS were stable.  Her labs were notable for WBC 10.9, hgb 11.6, plt 294, Na 134, potassium 3, creatinine 0.61.  UA with SG of  1.029, ketones, 11-20 WBC.  Her CXR demonstrated a large right pleural effusion and follow up CT chest demonstrated a large low-attenuation right pleural effusion with mild right to left mediastinal shift and significant compression of the underlying right lung.  She is being admitted for management of progressive pleural effusion with SOB and progressive weakness.    Prior to admission I spoke with Pulmonology and with Radiology.  She underwent diagnostic and therapeutic thoracentesis with removal of 1.5L of bright bloody fluid.  I have changed her admission from telemetry to stepdown in the event that she is having active bleeding.    Review of Systems:  General:  Denies fevers, chills, weight loss or weight gain HEENT:  Denies changes to hearing and vision, rhinorrhea, sinus congestion, sore throat CV:  Denies chest pain and palpitations, lower extremity edema.  PULM:  + SOB, cough.  Denies wheezing GI:  Denies nausea, vomiting, diarrhea.  Occasional constipation GU:  Denies dysuria, frequency, urgency ENDO:  Denies polyuria, polydipsia.   HEME:  Denies hematemesis, blood in stools, melena, abnormal bruising or bleeding.  LYMPH:  Denies lymphadenopathy.   MSK:  Chronic arthralgias, myalgias.   DERM:  Denies skin rash or ulcer.   NEURO:  Denies focal numbness, slurred speech, facial droop. Has generalized weakness and confusion PSYCH:  Denies anxiety and depression.    Past Medical History  Diagnosis Date  . ALLERGIC RHINITIS 05/16/2007  .  ANKLE PAIN, BILATERAL 07/24/2010  . Diarrhea 02/24/2010  . FATIGUE 08/28/2007  . GERD 05/16/2007  . GLUCOSE INTOLERANCE 09/02/2008  . HYPERLIPIDEMIA 01/18/2007  . HYPERTENSION 01/18/2007  . KNEE PAIN, LEFT 07/10/2009  . LEG PAIN, LEFT 07/24/2010  . OSTEOARTHRITIS, KNEES, BILATERAL 05/16/2007  . OSTEOPENIA 09/26/2007  . Pain in joint, multiple sites 07/24/2010  . SHOULDER IMPINGEMENT SYNDROME, LEFT 05/16/2007  . SHOULDER PAIN, RIGHT 07/10/2009  . SINUSITIS-  ACUTE-NOS 05/29/2007  . TRIGGER FINGER, RIGHT MIDDLE 07/10/2009  . Impaired glucose tolerance 02/16/2011  . Blind right eye   . Glaucoma, left eye    Past Surgical History  Procedure Laterality Date  . Inguinal herniorrhapy      x3  . S/p left knee arthroscopy  march 2011    Dr. Ranell Patrick   Social History:  reports that she has quit smoking. She has never used smokeless tobacco. She reports that she drinks alcohol. She reports that she does not use illicit drugs. Recently moved from her own home to live with her daughter and son-in-law who have been assisting her.  She is not able to ambulate currently due to weakness.   Allergies  Allergen Reactions  . Oxaprozin     REACTION: rash    Family History  Problem Relation Age of Onset  . Dementia Mother   . Stroke Father   . Hypertension Other      Prior to Admission medications   Medication Sig Start Date End Date Taking? Authorizing Provider  bimatoprost (LUMIGAN) 0.01 % SOLN Place 1 drop into both eyes at bedtime.    Yes Historical Provider, MD  Cholecalciferol (VITAMIN D) 2000 UNITS CAPS Take 2,000 Units by mouth daily.   Yes Historical Provider, MD  citalopram (CELEXA) 10 MG tablet Take 1 tablet (10 mg total) by mouth daily. 05/04/13  Yes Corwin Levins, MD  folic acid (FOLVITE) 1 MG tablet Take 1 tablet (1 mg total) by mouth daily. 02/19/11  Yes Corwin Levins, MD  meclizine (ANTIVERT) 12.5 MG tablet Take 1 tablet (12.5 mg total) by mouth 3 (three) times daily as needed. 03/09/13  Yes Corwin Levins, MD  methotrexate (RHEUMATREX) 2.5 MG tablet Take 2.5 mg by mouth 2 (two) times a week. Take 4 on fridays , and 3 on saturdays   Yes Historical Provider, MD  omeprazole (PRILOSEC) 20 MG capsule Take 20 mg by mouth daily.   Yes Historical Provider, MD   Physical Exam: Filed Vitals:   05/10/13 1610 05/10/13 1625 05/10/13 1630 05/10/13 1635  BP: 180/87 173/69 161/64 146/65  Pulse:      Temp:      TempSrc:      Resp:      SpO2: 98% 98% 98%  100%     General:  Thin CF, NAD, mild pallor  Eyes:  PERRL, anicteric, non-injected.    ENT:  Nares clear.  OP clear, non-erythematous without plaques or exudates.  MMM.  Neck:  Supple without TM or JVD.    Lymph:  No cervical, supraclavicular, or submandibular LAD.  Cardiovascular:  RRR, normal S1, S2, 2/6 systolic murmur at the left sternal border.  2+ pulses, warm extremities  Respiratory:  Absent BS on the right, dull to percussion.  Left with faint rales at base, otherwise clear.  No increased WOB.  Abdomen:  NABS.  Soft, ND/NT.    Skin:  No rashes or focal lesions.  Musculoskeletal:  Normal bulk and tone.  No LE edema.  Psychiatric:  A &  O x 4.  Appropriate affect.  Neurologic:  CN 3-12 intact.  4/5 strength throughout.  Sensation intact.  Labs on Admission:  Basic Metabolic Panel:  Recent Labs Lab 05/04/13 1638 05/10/13 1054 05/10/13 1441  NA 136 134*  --   K 3.3* 3.0*  --   CL 102 98  --   CO2 27 25  --   GLUCOSE 109* 116*  --   BUN 17 14  --   CREATININE 0.8 0.61  --   CALCIUM 9.0 9.0  --   MG  --   --  2.0   Liver Function Tests:  Recent Labs Lab 05/04/13 1638  AST 23  ALT 13  ALKPHOS 75  BILITOT 0.5  PROT 7.0  ALBUMIN 3.3*   No results found for this basename: LIPASE, AMYLASE,  in the last 168 hours No results found for this basename: AMMONIA,  in the last 168 hours CBC:  Recent Labs Lab 05/04/13 1638 05/10/13 1054  WBC 10.3 10.9*  NEUTROABS 7.7 9.0*  HGB 12.5 11.6*  HCT 38.3 34.5*  MCV 91.8 90.6  PLT 329.0 294   Cardiac Enzymes:  Recent Labs Lab 05/10/13 1124  TROPONINI <0.30    BNP (last 3 results) No results found for this basename: PROBNP,  in the last 8760 hours CBG: No results found for this basename: GLUCAP,  in the last 168 hours  Radiological Exams on Admission: Dg Chest 2 View  05/10/2013   CLINICAL DATA:  Coughing.  EXAM: CHEST  2 VIEW  COMPARISON:  05/04/2013.  03/07/2013.  FINDINGS: Mediastinum is normal.  Progressive right pleural fluid collection is present occupying almost the entire right hemithorax. Given patient's history of prior right 10th rib fracture this could represent a developing hemothorax as noted on prior report. CT can be obtained for further evaluation if clinically indicated. Left lung is clear. No pneumothorax. Heart size is normal. Lower right ribs not imaged on today's exam.  IMPRESSION: 1. Progressive right pleural fluid collection. Right pleural fluid collection occupies almost the entire right hemithorax. Given patient's history of prior rib fracture the possibility of developing hemothorax cannot be excluded. Chest CT can be obtained for further evaluation as clinically indicated. 2. No evidence of pneumothorax. Lower right ribs not imaged on today's exam . These results will be called to the ordering clinician or representative by the Radiologist Assistant, and communication documented in the PACS Dashboard.   Electronically Signed   By: Maisie Fus  Register   On: 05/10/2013 11:29   Ct Chest Wo Contrast  05/10/2013   CLINICAL DATA:  Shortness of breath.  Coughing.  EXAM: CT CHEST WITHOUT CONTRAST  TECHNIQUE: Multidetector CT imaging of the chest was performed following the standard protocol without IV contrast.  COMPARISON:  None.  FINDINGS: Progressive right pleural low attenuation fluid collection which now occupies almost the entire right hemithorax. Compressed lung centrally without visible infiltrate. Mild right-to-left mediastinal shift. Some residual aerated lung in right apex. Clear left lung.  Displaced right 9th and 10th rib fractures. Nondisplaced 11th and 12th rib fractures. Granulomatous calcified mediastinal lymph nodes. 3 vessel coronary artery calcification. No pericardial effusion. No left pleural effusion. No vertebral body compression fracture. Unremarkable appearing upper abdominal viscera. Negative chest wall.  IMPRESSION: Multiple subacute right 9-12 rib fractures  without healing. Large low-attenuation right pleural effusion, not consistent with hemothorax. Mild right to left mediastinal shift. Significant compression of the underlying right lung. Thoracentesis recommended for further evaluation.   Electronically Signed  By: Davonna Belling M.D.   On: 05/10/2013 14:42    EKG: Independently reviewed. NSR with borderline left axis deviation  Assessment/Plan Principal Problem:   Hemothorax on right Active Problems:   HYPERTENSION   ALLERGIC RHINITIS   Falls   Rib fracture   Chronic rheumatic arthritis   Depression   Leukocytosis   Normocytic anemia   Hypokalemia   Hyponatremia  ---  Right hemothorax with bright colored blood and increasing pallor on exam.  Likely secondary to falls with rib fractures.  -  F/u other thoracentesis labs including HCT, cell count, protein, glucose, LDH, culture, and cytology. -  Repeat CBC and cycle q6h overnight -  Type and screen -  Consider blood transfusion for hemodynamic instability or for hemoglobin < 7mg /dl -  Telemetry and close observation overnight -   Pulmonology consultation pending -  Appreciate radiology assistance -  Check PT/PTT  Normocytic anemia:  May be falsely elevated secondary to dehydration which is apparent on UA.   -  Will start gentle hydration overnight -  Cycle CBC as above  Progressive weakness may be secondary to progressive anemia or infection, although no definite infection based on UA or CT chest.   -  PT/OT evaluations -  SW consult as I suspect she will need SNF at discharge  UA with 11-20 WBC but very concentrated, minimal bacteria.   -  No ABX for now -  F/u urine culture  Memory difficulties.  Her mother suffered from vascular dementia but the hemothorax may be enough stress to cause some confusion also. -  CT head -  TSH wnl -  Check vit B12 and RPR  Depression.  Was started on celexa by her PCP a week ago, however, she only took a few doses.  Would like to hold  off on starting until she is getting better  Leukocytosis, likely due to hemoconcentration.  -  Repeat in AM post-hydration  Mild hyponatremia and hypokalemia due to dehydration from poor oral intake= -  Repeat in AM  Moderate protein-calorie malnutrition -  Dietary supplements -  Nutrition consultation -  Liberalize diet  Diet:  regular Access:  PIV IVF:  yes Proph:  SCD  Code Status: DNR Family Communication: spoke to patient, her daughter, her son-in-law.   Disposition Plan: Admit to stepdown  Time spent: 60 min Akiem Urieta [facility] Pager [phone]  If 7PM-7AM, please contact night-coverage [email or web address] Password [facility] 05/10/2013, 4:56 PM

## 2013-05-10 NOTE — ED Notes (Addendum)
Pt daugther reports, pt has had increased weakness x2 months. 8 weeks ago pt fell and broke right rib, weakness had steadily increased, confusion x2 weeks. Pt went to pcp last Friday and had chest xray, showed right plueral effusion. Daughter reports she believes pt has UTI, vaginal odor present. Pt denies pain at present.

## 2013-05-10 NOTE — ED Notes (Addendum)
Radiology called report: Progressive right pleural fluid collection. Right pleural fluid  collection occupies almost the entire right hemithorax. Given  patient's history of prior rib fracture the possibility of  developing hemothorax cannot be excluded. Chest CT can be obtained  for further evaluation as clinically indicated.

## 2013-05-10 NOTE — Progress Notes (Signed)
Discussed admission status with Dr. Malachi Bonds.  Patient may stay on telemetry.

## 2013-05-11 ENCOUNTER — Inpatient Hospital Stay (HOSPITAL_COMMUNITY): Payer: Medicare Other

## 2013-05-11 DIAGNOSIS — J90 Pleural effusion, not elsewhere classified: Principal | ICD-10-CM

## 2013-05-11 DIAGNOSIS — M064 Inflammatory polyarthropathy: Secondary | ICD-10-CM

## 2013-05-11 LAB — BASIC METABOLIC PANEL
BUN: 15 mg/dL (ref 6–23)
CO2: 24 mEq/L (ref 19–32)
Chloride: 101 mEq/L (ref 96–112)
Creatinine, Ser: 0.62 mg/dL (ref 0.50–1.10)
GFR calc non Af Amer: 83 mL/min — ABNORMAL LOW (ref >90)
Glucose, Bld: 96 mg/dL (ref 70–99)
Sodium: 134 mEq/L — ABNORMAL LOW (ref 135–145)

## 2013-05-11 LAB — CBC
HCT: 33.4 % — ABNORMAL LOW (ref 36.0–46.0)
MCH: 29.6 pg (ref 26.0–34.0)
MCV: 90.8 fL (ref 78.0–100.0)
Platelets: 267 10*3/uL (ref 150–400)
RDW: 14.1 % (ref 11.5–15.5)
WBC: 9.8 10*3/uL (ref 4.0–10.5)

## 2013-05-11 LAB — VITAMIN B12: Vitamin B-12: 699 pg/mL (ref 211–911)

## 2013-05-11 LAB — PH, BODY FLUID: pH, Fluid: 8

## 2013-05-11 LAB — URINE CULTURE
Colony Count: NO GROWTH
Culture: NO GROWTH

## 2013-05-11 LAB — RPR: RPR Ser Ql: NONREACTIVE

## 2013-05-11 MED ORDER — POTASSIUM CHLORIDE CRYS ER 20 MEQ PO TBCR
40.0000 meq | EXTENDED_RELEASE_TABLET | Freq: Once | ORAL | Status: AC
  Administered 2013-05-11: 11:00:00 40 meq via ORAL
  Filled 2013-05-11: qty 2

## 2013-05-11 MED ORDER — BOOST / RESOURCE BREEZE PO LIQD
1.0000 | Freq: Two times a day (BID) | ORAL | Status: DC
  Administered 2013-05-11 – 2013-05-14 (×2): 1 via ORAL

## 2013-05-11 NOTE — Progress Notes (Signed)
INITIAL NUTRITION ASSESSMENT  DOCUMENTATION CODES Per approved criteria  -Severe malnutrition in the context of acute illness or injury   INTERVENTION: Continue Ensure Complete BID in between meals Provide Resource Breeze BID with meals  NUTRITION DIAGNOSIS: Inadequate oral intake related to poor appetite as evidenced by 8% weight loss in the past month.   Goal: Pt to meet >/= 90% of their estimated nutrition needs   Monitor:  PO intake Weight Labs  Reason for Assessment: Consult/ MST 2  79 y.o. female  Admitting Dx: Hemothorax on right  ASSESSMENT: 79 y.o. year-old female with history of diet controlled HTN and HLD, GERD, rheumatoid arthritis on immunosuppression who presents with progressive SOB, cough, weakness, and memory problems. The patient was last at their baseline health several months ago. About two months ago, she had a fall and broke several ribs on the right chest. About one month ago, she had another fall and injured her right shoulder. Since that time, she has had progressive weakness. Admitted for management of progressive pleural effusion with SOB and progressive weakness.  Pt asleep at time of visit. Per family at bedside pt has had a poor appetite and eating very little for the past month and has lost about 6 lbs. Per weight history pt has lost 11 lbs in the past month- 8% weight loss. Per family all pt had for breakfast was coffee and orange juice which is comparable to intake PTA; pt is drinking well but, only eating a few bites.   Pt meets criteria for SEVERE MALNUTRITION in the context of Acute Illness as evidenced by 8% weight loss in the past month and estimated energy intake <50% of estimated energy needs for > 5 days. Marland Kitchen   Height: Ht Readings from Last 1 Encounters:  05/10/13 5\' 1"  (1.549 m)    Weight: Wt Readings from Last 1 Encounters:  05/10/13 127 lb 13.9 oz (58 kg)    Ideal Body Weight: 105 lbs  % Ideal Body Weight: 121%  Wt Readings  from Last 10 Encounters:  05/10/13 127 lb 13.9 oz (58 kg)  05/04/13 132 lb (59.875 kg)  03/09/13 138 lb 8 oz (62.823 kg)  03/07/13 140 lb (63.504 kg)  01/04/13 140 lb 6 oz (63.674 kg)  06/28/12 147 lb 8 oz (66.906 kg)  02/19/11 143 lb 2 oz (64.921 kg)  07/24/10 134 lb 12 oz (61.122 kg)  02/24/10 130 lb 8 oz (59.194 kg)  11/05/09 137 lb 8 oz (62.37 kg)    Usual Body Weight: unknown  % Usual Body Weight: NA  BMI:  Body mass index is 24.17 kg/(m^2).  Estimated Nutritional Needs: Kcal: 1340-1560  Protein: 60-70 grams Fluid: 1.4-1.6 L/day  Skin: intact  Diet Order: General  EDUCATION NEEDS: -No education needs identified at this time  No intake or output data in the 24 hours ending 05/11/13 1224  Last BM: 11/20   Labs:   Recent Labs Lab 05/04/13 1638 05/10/13 1054 05/10/13 1441 05/11/13 0338  NA 136 134*  --  134*  K 3.3* 3.0*  --  3.4*  CL 102 98  --  101  CO2 27 25  --  24  BUN 17 14  --  15  CREATININE 0.8 0.61  --  0.62  CALCIUM 9.0 9.0  --  8.5  MG  --   --  2.0  --   GLUCOSE 109* 116*  --  96    CBG (last 3)  No results found for this basename:  GLUCAP,  in the last 72 hours  Scheduled Meds: . cholecalciferol  2,000 Units Oral Daily  . docusate sodium  100 mg Oral BID  . feeding supplement (ENSURE COMPLETE)  237 mL Oral BID BM  . folic acid  1 mg Oral Daily  . latanoprost  1 drop Both Eyes QHS  . loratadine  10 mg Oral Daily  . pantoprazole  40 mg Oral Daily  . senna  1 tablet Oral BID  . sodium chloride  3 mL Intravenous Q12H    Continuous Infusions:   Past Medical History  Diagnosis Date  . ALLERGIC RHINITIS 05/16/2007  . ANKLE PAIN, BILATERAL 07/24/2010  . Diarrhea 02/24/2010  . FATIGUE 08/28/2007  . GERD 05/16/2007  . GLUCOSE INTOLERANCE 09/02/2008  . HYPERLIPIDEMIA 01/18/2007  . HYPERTENSION 01/18/2007  . KNEE PAIN, LEFT 07/10/2009  . LEG PAIN, LEFT 07/24/2010  . OSTEOARTHRITIS, KNEES, BILATERAL 05/16/2007  . OSTEOPENIA 09/26/2007  . Pain  in joint, multiple sites 07/24/2010  . SHOULDER IMPINGEMENT SYNDROME, LEFT 05/16/2007  . SHOULDER PAIN, RIGHT 07/10/2009  . SINUSITIS- ACUTE-NOS 05/29/2007  . TRIGGER FINGER, RIGHT MIDDLE 07/10/2009  . Impaired glucose tolerance 02/16/2011  . Blind right eye   . Glaucoma, left eye     Past Surgical History  Procedure Laterality Date  . Inguinal herniorrhapy      x3  . S/p left knee arthroscopy  march 2011    Dr. Quay Burow RD, LDN Inpatient Clinical Dietitian Pager: [phone] After Hours Pager: [phone]

## 2013-05-11 NOTE — Progress Notes (Signed)
eLink Physician-Brief Progress Note Patient Name: RACHELE LAMASTER DOB: [birth year] MRN: [id number]  Date of Service  05/11/2013   HPI/Events of Note   D/w dr simonds - has residual right effusion  eICU Interventions  Can rpt thoracentesis under US guidance - favor traumatic effusion, order placed   Intervention Category Intermediate Interventions: Diagnostic test evaluation  ALVA,RAKESH V. 05/11/2013, 3:51 PM

## 2013-05-11 NOTE — Progress Notes (Signed)
Spoke with pt's son concerning discharge plans. He states that he spoke with CSW, for maybe SNF for Rehab.Will continue to follow

## 2013-05-11 NOTE — Progress Notes (Addendum)
Clinical Social Work Department CLINICAL SOCIAL WORK PLACEMENT NOTE 05/11/2013  Patient:  Jordan Henderson, Jordan Henderson  Account Number:  [id number] Admit date:  05/10/2013  Clinical Social Worker:  Cori Razor, LCSW  Date/time:  05/11/2013 03:38 PM  Clinical Social Work is seeking post-discharge placement for this patient at the following level of care:   SKILLED NURSING   (*CSW will update this form in Epic as items are completed)   05/11/2013  Patient/family provided with [facility] Department of Clinical Social Work's list of facilities offering this level of care within the geographic area requested by the patient (or if unable, by the patient's family).  05/11/2013  Patient/family informed of their freedom to choose among providers that offer the needed level of care, that participate in Medicare, Medicaid or managed care program needed by the patient, have an available bed and are willing to accept the patient.    Patient/family informed of [facility]' ownership interest in [facility], as well as of the fact that they are under no obligation to receive care at this facility.  PASARR submitted to EDS on 05/11/2013 PASARR number received from EDS on 05/11/2013  FL2 transmitted to all facilities in geographic area requested by pt/family on  05/11/2013 FL2 transmitted to all facilities within larger geographic area on   Patient informed that his/her managed care company has contracts with or will negotiate with  certain facilities, including the following:     Patient/family informed of bed offers received:  05/12/2013 Patient chooses bed at [facility] Physician recommends and patient chooses bed at    Patient to be transferred to  on  [facility] at [place] on 05/14/2013 Patient to be transferred to facility by ambulance Sharin Mons)  The following physician request were entered in Epic:   Additional Comments:  Cori Razor LCSW [phone]   Jacklynn Lewis,  MSW, LCSWA  Clinical Social Work Coverage for [employer] [phone]

## 2013-05-11 NOTE — Progress Notes (Signed)
[facility] PROGRESS NOTE  Jordan Henderson ZOX:[id number] DOB: 10/07/1930 DOA: 05/10/2013 PCP: Oliver Barre, MD  Assessment/Plan  Right hemothorax with bright colored blood and increasing pallor on exam. Likely secondary to falls with rib fractures.  -  HCT < 2, cell count WBC 73, protein 3.9, glucose 87, pf LDH 244 -  Light's criteria are positive, LDH ratio of 0.9, protein ratio 0.56, but not profoundly so.  Her glucose is normal and pH is 8.  She has few inflammatory cells.   -  Most likely she had a small hemothorax post fall and developed a reactive effusion which may be exacerbated by her underlying rheumatologic disorder -  F/u cytology -  F/u culture - Spoke with Dr. Sung Amabile who recommended repeat thoracentesis to drain residual fluid.  Pulmonology to perform tonight or tomorrow -  She will need close outpatient follow up  Normocytic anemia: Mild decrease in hgb with hydration last night.      Progressive weakness may be secondary to progressive anemia or infection, although no definite infection based on UA or CT chest.  PT/OT recommending SNF -  SW to assist  UA with 11-20 WBC but very concentrated, minimal bacteria.   - No ABX for now  - F/u urine culture   Memory difficulties. Her mother suffered from vascular dementia but the hemothorax may be enough stress to cause some confusion also.  - CT head demonstrates atrophy - TSH wnl  - vit B12 wnl -  RPR NR  Depression. Was started on celexa by her PCP a week ago, however, she only took a few doses. Would like to hold off on starting until she is getting better   Leukocytosis, likely due to hemoconcentration and resolved.  Mild hyponatremia and hypokalemia due to dehydration from poor oral intake, stable.    Moderate protein-calorie malnutrition  - Dietary supplements  - Nutrition consultation  - Liberalize diet   Rheumatoid arthritis -  Hold methotrexate  Diet: regular  Access: PIV  IVF: off Proph: SCD    Code Status: DNR  Family Communication: spoke to patient, her daughter, her son-in-law.  Disposition Plan:  Await repeat thoracentesis.    Consultants:  Pulmonology, Dr. Sung Amabile by phone  Procedures:  Thoracentesis 11/20  CXR   CT chest  CT head  Antibiotics:  None   HPI/Subjective:  States that her breathing is better today, but she is still SOB with exertion and fatigued.    Objective: Filed Vitals:   05/10/13 2109 05/11/13 0458 05/11/13 1351 05/11/13 1448  BP: 136/57 158/74  135/55  Pulse: 87 73  94  Temp: 97.7 F (36.5 C) 98.2 F (36.8 C)  97.9 F (36.6 C)  TempSrc: Oral Oral  Oral  Resp:  18  20  Height:      Weight:      SpO2: 95% 99% 96% 96%    Intake/Output Summary (Last 24 hours) at 05/11/13 1558 Last data filed at 05/11/13 1300  Gross per 24 hour  Intake    120 ml  Output      0 ml  Net    120 ml   Filed Weights   05/10/13 1712  Weight: 58 kg (127 lb 13.9 oz)    Exam:   General:  Thin F, No acute distress  HEENT:  NCAT, MMM  Cardiovascular:  RRR, nl S1, S2, 2/6 systolic murmur LSB, no rg, 2+ pulses, warm extremities  Respiratory:  Left side clear and new breath sounds heard at  right apex.  No wheezes or rhonchi.  Base still diminished, no increased WOB  Abdomen:   NABS, soft, NT/ND  MSK:   Normal tone and bulk, no LEE  Neuro:  Grossly intact  Data Reviewed: Basic Metabolic Panel:  Recent Labs Lab 05/04/13 1638 05/10/13 1054 05/10/13 1441 05/11/13 0338  NA 136 134*  --  134*  K 3.3* 3.0*  --  3.4*  CL 102 98  --  101  CO2 27 25  --  24  GLUCOSE 109* 116*  --  96  BUN 17 14  --  15  CREATININE 0.8 0.61  --  0.62  CALCIUM 9.0 9.0  --  8.5  MG  --   --  2.0  --    Liver Function Tests:  Recent Labs Lab 05/04/13 1638  AST 23  ALT 13  ALKPHOS 75  BILITOT 0.5  PROT 7.0  ALBUMIN 3.3*   No results found for this basename: LIPASE, AMYLASE,  in the last 168 hours No results found for this basename: AMMONIA,  in  the last 168 hours CBC:  Recent Labs Lab 05/04/13 1638 05/10/13 1054 05/10/13 1731 05/10/13 2302 05/11/13 0338  WBC 10.3 10.9* 8.7 10.3 9.8  NEUTROABS 7.7 9.0*  --   --   --   HGB 12.5 11.6* 11.1* 11.2* 10.9*  HCT 38.3 34.5* 33.5* 33.6* 33.4*  MCV 91.8 90.6 90.8 90.8 90.8  PLT 329.0 294 278 260 267   Cardiac Enzymes:  Recent Labs Lab 05/10/13 1124  TROPONINI <0.30   BNP (last 3 results) No results found for this basename: PROBNP,  in the last 8760 hours CBG: No results found for this basename: GLUCAP,  in the last 168 hours  Recent Results (from the past 240 hour(s))  URINE CULTURE     Status: None   Collection Time    05/04/13  4:38 PM      Result Value Range Status   Organism ID, Bacteria 4,000 COLONIES/ML Insignificant Growth   Final  BODY FLUID CULTURE     Status: None   Collection Time    05/10/13  4:26 PM      Result Value Range Status   Specimen Description PLEURAL RIGHT THORENCENTESIS   Final   Special Requests Immunocompromised   Final   Gram Stain     Final   Value: FEW WBC PRESENT,BOTH PMN AND MONONUCLEAR     NO ORGANISMS SEEN     Performed at [facility]   Culture     Final   Value: NO GROWTH 1 DAY     Performed at [facility]   Report Status PENDING   Incomplete     Studies: Dg Chest 1 View  05/10/2013   CLINICAL DATA:  Post right thoracentesis  EXAM: CHEST - 1 VIEW  COMPARISON:  05/10/2013 at 1108 hr  FINDINGS: Enlargement of cardiac silhouette.  Tortuous aorta.  Moderate right pleural effusion decreased since previous exam.  No pneumothorax.  Persistent right basilar atelectasis.  Diffuse osseous demineralization.  IMPRESSION: Decrease in right pleural effusion and right basilar atelectasis post pneumothorax though moderate right pleural effusion remains.  No pneumothorax post thoracentesis.   Electronically Signed   By: Ulyses Southward M.D.   On: 05/10/2013 17:08   Dg Chest 2 View  05/10/2013   CLINICAL DATA:  Coughing.  EXAM:  CHEST  2 VIEW  COMPARISON:  05/04/2013.  03/07/2013.  FINDINGS: Mediastinum is normal. Progressive right pleural fluid collection  is present occupying almost the entire right hemithorax. Given patient's history of prior right 10th rib fracture this could represent a developing hemothorax as noted on prior report. CT can be obtained for further evaluation if clinically indicated. Left lung is clear. No pneumothorax. Heart size is normal. Lower right ribs not imaged on today's exam.  IMPRESSION: 1. Progressive right pleural fluid collection. Right pleural fluid collection occupies almost the entire right hemithorax. Given patient's history of prior rib fracture the possibility of developing hemothorax cannot be excluded. Chest CT can be obtained for further evaluation as clinically indicated. 2. No evidence of pneumothorax. Lower right ribs not imaged on today's exam . These results will be called to the ordering clinician or representative by the Radiologist Assistant, and communication documented in the PACS Dashboard.   Electronically Signed   By: Maisie Fus  Register   On: 05/10/2013 11:29   Ct Head Wo Contrast  05/10/2013   CLINICAL DATA:  Increase memory loss.  Mild confusion.  EXAM: CT HEAD WITHOUT CONTRAST  TECHNIQUE: Contiguous axial images were obtained from the base of the skull through the vertex without intravenous contrast.  COMPARISON:  None.  FINDINGS: No intracranial hemorrhage.  Small vessel disease type changes without CT evidence of large acute infarct.  Atrophy with ventricular prominence felt to be related to atrophy rather than hydrocephalus.  No intracranial mass lesion noted on this unenhanced exam.  Vascular calcifications.  Prior surgery involving the right globe.  IMPRESSION: No acute abnormality.  Please see above   Electronically Signed   By: Bridgett Larsson M.D.   On: 05/10/2013 19:16   Ct Chest Wo Contrast  05/10/2013   CLINICAL DATA:  Shortness of breath.  Coughing.  EXAM: CT CHEST  WITHOUT CONTRAST  TECHNIQUE: Multidetector CT imaging of the chest was performed following the standard protocol without IV contrast.  COMPARISON:  None.  FINDINGS: Progressive right pleural low attenuation fluid collection which now occupies almost the entire right hemithorax. Compressed lung centrally without visible infiltrate. Mild right-to-left mediastinal shift. Some residual aerated lung in right apex. Clear left lung.  Displaced right 9th and 10th rib fractures. Nondisplaced 11th and 12th rib fractures. Granulomatous calcified mediastinal lymph nodes. 3 vessel coronary artery calcification. No pericardial effusion. No left pleural effusion. No vertebral body compression fracture. Unremarkable appearing upper abdominal viscera. Negative chest wall.  IMPRESSION: Multiple subacute right 9-12 rib fractures without healing. Large low-attenuation right pleural effusion, not consistent with hemothorax. Mild right to left mediastinal shift. Significant compression of the underlying right lung. Thoracentesis recommended for further evaluation.   Electronically Signed   By: Davonna Belling M.D.   On: 05/10/2013 14:42    Scheduled Meds: . cholecalciferol  2,000 Units Oral Daily  . docusate sodium  100 mg Oral BID  . feeding supplement (ENSURE COMPLETE)  237 mL Oral BID BM  . feeding supplement (RESOURCE BREEZE)  1 Container Oral BID WC  . folic acid  1 mg Oral Daily  . latanoprost  1 drop Both Eyes QHS  . loratadine  10 mg Oral Daily  . pantoprazole  40 mg Oral Daily  . senna  1 tablet Oral BID  . sodium chloride  3 mL Intravenous Q12H   Continuous Infusions:   Principal Problem:   Hemothorax on right Active Problems:   HYPERTENSION   ALLERGIC RHINITIS   Falls   Rib fracture   Chronic rheumatic arthritis   Depression   Leukocytosis   Normocytic anemia   Hypokalemia  Hyponatremia    Time spent: 30 min    Melrose Kearse, [facility]  [facility] Pager [phone]. If 7PM-7AM, please contact  night-coverage at [email or web address], password [email or web address] 05/11/2013, 3:58 PM  LOS: 1 day

## 2013-05-11 NOTE — Evaluation (Signed)
Physical Therapy Evaluation Patient Details Name: Jordan Henderson MRN: [id number] DOB: Dec 24, 1930 Today's Date: 05/11/2013 Time: 1330-1350 PT Time Calculation (min): 20 min  PT Assessment / Plan / Recommendation History of Present Illness  Pt was admitted with SOB, progressive weakness and memory difficulties.  Dx with hemothorax.  Pt has h/o hold R rib fx and R shoulder hurt in falls.  She has needed help from children for about a month; prior to this she was independent  Clinical Impression  *Pt admitted with R hemothorax*. Pt currently with functional limitations due to the deficits listed below (see PT Problem List).  Pt will benefit from skilled PT to increase their independence and safety with mobility to allow discharge to the venue listed below.   **    PT Assessment  Patient needs continued PT services    Follow Up Recommendations  SNF    Does the patient have the potential to tolerate intense rehabilitation      Barriers to Discharge        Equipment Recommendations  None recommended by PT    Recommendations for Other Services OT consult   Frequency Min 3X/week    Precautions / Restrictions Precautions Precautions: Fall Restrictions Weight Bearing Restrictions: No   Pertinent Vitals/Pain **99% on RA at rest 96% on RA walking HR 109 with walking*      Mobility  Bed Mobility Bed Mobility: Not assessed Supine to Sit: 3: Mod assist Details for Bed Mobility Assistance: assist for trunk Transfers Transfers: Sit to Stand;Stand to Sit Sit to Stand: 4: Min assist;From chair/3-in-1;With upper extremity assist Stand to Sit: 4: Min guard;To chair/3-in-1;With upper extremity assist;With armrests Details for Transfer Assistance: verbal/manual cues for hand placement Ambulation/Gait Ambulation/Gait Assistance: 4: Min assist Ambulation Distance (Feet): 120 Feet Assistive device: Rolling walker Ambulation/Gait Assistance Details: Pt tends to walk too far behind frame  of RW Gait Pattern: Decreased step length - right;Decreased step length - left;Trunk flexed Gait velocity: decr General Gait Details: VCs to increase step length and to step into frame of RW, pt able to respond to instructions briefly then required frequent reminders    Exercises     PT Diagnosis: Generalized weakness  PT Problem List: Decreased activity tolerance;Decreased mobility;Decreased cognition PT Treatment Interventions: Gait training;DME instruction;Functional mobility training;Therapeutic exercise;Patient/family education     PT Goals(Current goals can be found in the care plan section) Acute Rehab PT Goals Patient Stated Goal: none stated; happy to sit up in chair PT Goal Formulation: With patient/family Time For Goal Achievement: 05/25/13 Potential to Achieve Goals: Good  Visit Information  Last PT Received On: 05/11/13 Assistance Needed: +1 History of Present Illness: Pt was admitted with SOB, progressive weakness and memory difficulties.  Dx with hemothorax.  Pt has h/o hold R rib fx and R shoulder hurt in falls.  She has needed help from children for about a month; prior to this she was independent       Prior Functioning  Home Living Family/patient expects to be discharged to:: Skilled nursing facility Living Arrangements:  (has had children helping for a mont) Available Help at Discharge: Skilled Nursing Facility Type of Home: House Home Access: Stairs to enter [employer] of Steps: 2 Entrance Stairs-Rails: Right;Left Home Layout: One level Home Equipment: Environmental consultant - 2 wheels;Cane - single point;Shower seat Prior Function Level of Independence: Needs assistance Gait / Transfers Assistance Needed: walked with [facility] ADL's / Homemaking Assistance Needed: assist for bathing, meal prep, housework Communication Communication: No difficulties Dominant  Hand:  (used both)    Cognition  Cognition Arousal/Alertness: Awake/alert Behavior During Therapy:  WFL for tasks assessed/performed Overall Cognitive Status: History of cognitive impairments - at baseline Memory: Decreased short-term memory    Extremity/Trunk Assessment Upper Extremity Assessment Upper Extremity Assessment: Defer to OT evaluation Lower Extremity Assessment Lower Extremity Assessment: Overall WFL for tasks assessed Cervical / Trunk Assessment Cervical / Trunk Assessment: Kyphotic   Balance    End of Session PT - End of Session Equipment Utilized During Treatment: Gait belt Activity Tolerance: Patient limited by fatigue Patient left: in chair;with call bell/phone within reach;with family/visitor present Nurse Communication: Mobility status  GP     Ralene Bathe Kistler 05/11/2013, 1:59 PM [phone]

## 2013-05-11 NOTE — Progress Notes (Signed)
Clinical Social Work Department BRIEF PSYCHOSOCIAL ASSESSMENT 05/11/2013  Patient:  Jordan Henderson, Jordan Henderson     Account Number:  [id number]     Admit date:  05/10/2013  Clinical Social Worker:  Candie Chroman  Date/Time:  05/11/2013 03:21 PM  Referred by:  Physician  Date Referred:  05/11/2013 Referred for  SNF Placement   Other Referral:   Interview type:  Patient Other interview type:    PSYCHOSOCIAL DATA Living Status:  ALONE Admitted from facility:   Level of care:   Primary support name:  Avel Sensor Primary support relationship to patient:  CHILD, ADULT Degree of support available:   supportive    CURRENT CONCERNS Current Concerns  Post-Acute Placement   Other Concerns:    SOCIAL WORK ASSESSMENT / PLAN Pt is an 79 yr old female living at home prior to hospitalization. CSW met with pt / son / daughter at bedside to assist with d/c planing. SNF will be needed following hospital d/c. Pt / family are interested in [place] in [place] . SNF contacted and will review referral. Family also requested search in [facility]. CSW will continue to follow to assist with d/c planning.   Assessment/plan status:  Psychosocial Support/Ongoing Assessment of Needs Other assessment/ plan:   Information/referral to community resources:   SNF list provided. Insurance coverage for SNF reviewed.    PATIENT'S/FAMILY'S RESPONSE TO PLAN OF CARE: Pt had been living at home with 24/7 support. Family feels ALF may be needed following rehab. They have already begun looking into ALF's.    Cori Razor LCSW [phone]

## 2013-05-11 NOTE — Procedures (Signed)
US guided therapeutic right thoracentesis performed yielding 1.1 liters bloody fluid. F/u CXR pending. No immediate complications.

## 2013-05-11 NOTE — Evaluation (Signed)
Occupational Therapy Evaluation Patient Details Name: Jordan Henderson MRN: [id number] DOB: 1930/08/19 Today's Date: 05/11/2013 Time: 4098-1191 OT Time Calculation (min): 22 min  OT Assessment / Plan / Recommendation History of present illness Pt was admitted with SOB, progressive weakness and memory difficulties.  Dx with hemothorax.  Pt has h/o hold R rib fx and R shoulder hurt in falls.  She has needed help from children for about a month; prior to this she was independent   Clinical Impression   Pt was admitted for the above.  She will benefit from skilled OT to increase activity tolerance to decrease burden of care with adls and maximize her performance.      OT Assessment  Patient needs continued OT Services    Follow Up Recommendations  SNF    Barriers to Discharge      Equipment Recommendations  3 in 1 bedside comode (if she doesn't have)    Recommendations for Other Services    Frequency  Min 2X/week    Precautions / Restrictions Precautions Precautions: Fall Restrictions Weight Bearing Restrictions: No   Pertinent Vitals/Pain No c/o pain.  No dyspnea:  Pt on 02    ADL  Grooming: Brushing hair;Moderate assistance Where Assessed - Grooming: Supported sitting Upper Body Bathing: Moderate assistance Where Assessed - Upper Body Bathing: Supported sitting Lower Body Bathing: Moderate assistance Where Assessed - Lower Body Bathing: Supported sit to stand Upper Body Dressing: Moderate assistance Where Assessed - Upper Body Dressing: Supported sitting Lower Body Dressing: Maximal assistance Where Assessed - Lower Body Dressing: Supported sit to Pharmacist, hospital: Simulated;Moderate assistance (pt 70%, cues for pivot to chair) Toilet Transfer Method: Stand pivot Toileting - Clothing Manipulation and Hygiene: Maximal assistance Where Assessed - Toileting Clothing Manipulation and Hygiene: Sit to stand from 3-in-1 or toilet Equipment Used: Rolling  walker Transfers/Ambulation Related to ADLs: performed spt without walker then performed sit to stand with it ADL Comments: pt fatiques easily    OT Diagnosis: Generalized weakness  OT Problem List: Decreased strength;Decreased activity tolerance;Impaired balance (sitting and/or standing);Decreased cognition OT Treatment Interventions: Self-care/ADL training;DME and/or AE instruction;Balance training;Patient/family education;Therapeutic exercise;Therapeutic activities;Cognitive remediation/compensation   OT Goals(Current goals can be found in the care plan section) Acute Rehab OT Goals Patient Stated Goal: none stated; happy to sit up in chair OT Goal Formulation: With family Time For Goal Achievement: 05/25/13 Potential to Achieve Goals: Good ADL Goals Pt Will Perform Grooming: with set-up;sitting Pt Will Perform Upper Body Bathing: with supervision;sitting Pt Will Transfer to Toilet: with min assist;ambulating;bedside commode Additional ADL Goal #1: pt will stand for 3 minutes with min guard for adls Additional ADL Goal #2: pt will tolerate 20 minutes of activity/exercise with 4 rest breaks  Visit Information  Last OT Received On: 05/11/13 Assistance Needed: +1 History of Present Illness: Pt was admitted with SOB, progressive weakness and memory difficulties.  Dx with hemothorax.  Pt has h/o hold R rib fx and R shoulder hurt in falls.  She has needed help from children for about a month; prior to this she was independent       Prior Functioning     Home Living Family/patient expects to be discharged to:: Unsure Living Arrangements:  (has had children helping for a mont) Prior Function Level of Independence: Needs assistance (recently) Communication Communication: No difficulties Dominant Hand:  (used both)         Vision/Perception     Cognition  Cognition Arousal/Alertness: Awake/alert Behavior During Therapy: WFL for tasks assessed/performed  Overall Cognitive  Status: History of cognitive impairments - at baseline    Extremity/Trunk Assessment Upper Extremity Assessment Upper Extremity Assessment: RUE deficits/detail (hard end feel at 90 R shoulder; strength throughout 3/5)     Mobility Bed Mobility Bed Mobility: Supine to Sit Supine to Sit: 3: Mod assist Details for Bed Mobility Assistance: assist for trunk Transfers Transfers: Sit to Stand Sit to Stand: 4: Min assist;From bed;From chair/3-in-1;With upper extremity assist Details for Transfer Assistance: assist to rise and steady     Exercise     Balance     End of Session OT - End of Session Activity Tolerance: Patient limited by fatigue Patient left: in chair;with call bell/phone within reach;with family/visitor present Nurse Communication:  (no chair alarm used:  family present 24/7)  GO     Shawn Carattini 05/11/2013, 12:52 PM  Marica Otter, OTR/L [phone] 05/11/2013

## 2013-05-12 DIAGNOSIS — I10 Essential (primary) hypertension: Secondary | ICD-10-CM

## 2013-05-12 LAB — BASIC METABOLIC PANEL
BUN: 17 mg/dL (ref 6–23)
CO2: 24 mEq/L (ref 19–32)
Calcium: 8.5 mg/dL (ref 8.4–10.5)
Creatinine, Ser: 0.63 mg/dL (ref 0.50–1.10)
GFR calc non Af Amer: 82 mL/min — ABNORMAL LOW (ref >90)
Glucose, Bld: 110 mg/dL — ABNORMAL HIGH (ref 70–99)
Sodium: 135 mEq/L (ref 135–145)

## 2013-05-12 LAB — CBC
MCH: 29.7 pg (ref 26.0–34.0)
MCHC: 32.6 g/dL (ref 30.0–36.0)
MCV: 91.1 fL (ref 78.0–100.0)
Platelets: 276 10*3/uL (ref 150–400)
RBC: 3.81 MIL/uL — ABNORMAL LOW (ref 3.87–5.11)
WBC: 9.7 10*3/uL (ref 4.0–10.5)

## 2013-05-12 MED ORDER — ENSURE COMPLETE PO LIQD: 237.0000 mL | Freq: Two times a day (BID) | ORAL | Status: AC

## 2013-05-12 MED ORDER — DSS 100 MG PO CAPS: 100.0000 mg | ORAL_CAPSULE | Freq: Two times a day (BID) | ORAL | Status: DC

## 2013-05-12 MED ORDER — BOOST / RESOURCE BREEZE PO LIQD: 1.0000 | Freq: Two times a day (BID) | ORAL | Status: DC

## 2013-05-12 NOTE — Progress Notes (Signed)
Monitoring oral intake today.  Will start IV fluids this evening if not eating and drinking and consider starting megace.

## 2013-05-12 NOTE — Progress Notes (Signed)
Provided Pt with bed offers.  Pt to review offers with family.  Pt informed CSW that she had a bowel movement and needed RN assistance; she declined to have CSW review offers with her, at this time.  Weekday CSW to follow.  Providence Crosby, LCSWA Clinical Social Work [phone]

## 2013-05-12 NOTE — Discharge Summary (Signed)
Physician Discharge Summary  Jordan Henderson ZOX:[id number] DOB: 1930/12/09 DOA: 05/10/2013  PCP: Jordan Barre, MD  Admit date: 05/10/2013 Discharge date: 05/12/2013  Recommendations for Outpatient Follow-up:  1. Ongoing PT/OT at SNF 2. Pulmonology appointment.  Please call on Monday to schedule an appointment for within 1 week of discharge and family may need assistance with transporting patient to appointment.   Her thoracentesis culture and cytology are pending.   3. Please encourage plenty of fluids  Discharge Diagnoses:  Principal Problem:   Hemothorax on right Active Problems:   HYPERTENSION   ALLERGIC RHINITIS   Falls   Rib fracture   Chronic rheumatic arthritis   Depression   Leukocytosis   Normocytic anemia   Hypokalemia   Hyponatremia   Discharge Condition: stable, improve  Diet recommendation: regular  Wt Readings from Last 3 Encounters:  05/10/13 58 kg (127 lb 13.9 oz)  05/04/13 59.875 kg (132 lb)  03/09/13 62.823 kg (138 lb 8 oz)    History of present illness:   The patient is a 79 y.o. year-old female with history of diet controlled HTN and HLD, GERD, rheumatoid arthritis on immunosuppression who presents with progressive SOB, cough, weakness, and memory problems. The patient was last at their baseline health several months ago. About two months ago, she had a fall and broke several ribs on the right chest. Her diagnostic CXR demonstrated no pleural effusion. About one month ago, she had another fall and injured her right shoulder. Since that time, she has had progressive weakness. As of 4 months ago, she was living independently, however, over the last couple of weaks, she has needed assistance to get out of bed and get to the bathroom. She has been coughing frequently and over the last three weeks, she has been Jordan Henderson of breath with minimal exertion. Additionally, her family has noticed that she has had increases Jordan Henderson term memory problems. For example, sometimes,  she thinks she is at a hotel even though she is in a house she has known for 30 years. A week ago, she was seen by her PCP, who repeated her CXR which demonstrated a large right pleural effusion (05/04/2013) and she was referred to pulmonology. She actually had an appointment scheduled for today, but due to her progressive SOB, her family brought her to the ER. Of note, she smoked "but did not inhale" many years ago and there is no family history of lung cancer. She was on aspirin, but stopped more than 3 months ago, even before her first fall, and has not been on any other blood thinners.   In the ER, her VS were stable. Her labs were notable for WBC 10.9, hgb 11.6, plt 294, Na 134, potassium 3, creatinine 0.61. UA with SG of 1.029, ketones, 11-20 WBC. Her CXR demonstrated a large right pleural effusion and follow up CT chest demonstrated a large low-attenuation right pleural effusion with mild right to left mediastinal shift and significant compression of the underlying right lung. She is being admitted for management of progressive pleural effusion with SOB and progressive weakness.   Prior to admission I spoke with Pulmonology and with Radiology. She underwent diagnostic and therapeutic thoracentesis with removal of 1.5L of bright bloody fluid. I have changed her admission from telemetry to stepdown in the event that she is having active bleeding.    Hospital Course:   Right pleural effusion.  Jordan Henderson underwent thoracentesis by radiology at the time of admission with removal of 1.5L of bright red  fluid.  Tests demonstrated HCT < 2, cell count WBC 73, protein 3.9, glucose 87, pf LDH 244.  Light's criteria were positive, LDH ratio of 0.9, protein ratio 0.56, but not profoundly so. Her glucose is normal and pH was 8. She had few inflammatory cells on smear and culture remained negative.  Most likely she had a small hemothorax post fall and developed a reactive effusion which may have been exacerbated by  her underlying rheumatologic disorder.  I spoke with Jordan Henderson from Pulmonology who recommended repeating her thoracentesis to drain residual fluid.  Radiology drained an additional 1L from her right chest with a small amount of effusion remaining post-procedure and no pneumothorax.  Her SOB drastically improved after her second thoracentesis and although she remained weak, she was able to stand and transfer without dyspnea whereas previously she was SOB at rest.   Normocytic anemia:   Hemoglobin remained stable near 11mg /dl.      Progressive weakness may be secondary to the increased exertion she had compensating for the large pleural effusion on the right.  She did not have progressive anemia.  Her urine culture was negative and she did not have evidence of pneumonia on CXR.  PT/OT recommended SNF.    No urinary tract infection.  Culture neg.  Mild dementia, having some memory difficulties and mild confusion without agitation.  Her mother suffered from dementia.  CT head demonstrates atrophy.  TSH wnl, vit B12 wnl, RPR NR.  Depression. Was started on celexa by her PCP a week ago, however, she only took a few doses. Would like to hold off on starting until she is getting better.  Leukocytosis, likely due to hemoconcentration and resolved.   Mild hyponatremia and hypokalemia due to dehydration from poor oral intake, resolving with hydration and supplementation of potassium.    Moderate protein-calorie malnutrition.  She was seen by nutrition and started on regular diet and dietary supplements.  She continued to have poor oral intake.  Her diet may improve after removal of her effusion, however, consider starting megace.    Rheumatoid arthritis.  Held methotrexate for now pending culture data from pleural effusion.    Consultants:  Pulmonology, Jordan Henderson by phone Radiology Procedures:  Thoracentesis 11/20 and again on 11/21 CXR  CT chest  CT head Antibiotics:  None    Discharge  Exam: Filed Vitals:   05/12/13 0536  BP: 151/65  Pulse: 80  Temp: 98.2 F (36.8 C)  Resp: 18   Filed Vitals:   05/11/13 1728 05/11/13 1836 05/11/13 2054 05/12/13 0536  BP: 144/76 137/61 138/61 151/65  Pulse:  101 101 80  Temp:  98.3 F (36.8 C) 98.5 F (36.9 C) 98.2 F (36.8 C)  TempSrc:  Oral Oral Oral  Resp:  18 18 18   Height:      Weight:      SpO2: 98% 97% 93% 96%    General: Thin F, No acute distress  HEENT: NCAT, MMM  Cardiovascular: RRR, nl S1, S2, 2/6 systolic murmur LSB, no rg, 2+ pulses, warm extremities  Respiratory: Left side clear.  Diminished breath sounds at the right base with rales at the right base without wheezes.  Rare rhonchi.  No increased WOB  Abdomen: NABS, soft, NT/ND  MSK: Normal tone and bulk, no LEE  Neuro: Grossly intact   Discharge Instructions      Discharge Orders   Future Appointments Provider Department Dept Phone   06/01/2013 2:45 PM Corwin Levins, MD [facility] -[place] [phone]   06/29/2013 9:30 AM Corwin Levins, MD [facility] -[place] [phone]   Future Orders Complete By Expires   Call MD for:  difficulty breathing, headache or visual disturbances  As directed    Call MD for:  extreme fatigue  As directed    Call MD for:  hives  As directed    Call MD for:  persistant dizziness or light-headedness  As directed    Call MD for:  persistant nausea and vomiting  As directed    Call MD for:  severe uncontrolled pain  As directed    Call MD for:  temperature >100.4  As directed    Diet general  As directed    Increase activity slowly  As directed        Medication List    STOP taking these medications       citalopram 10 MG tablet  Commonly known as:  CELEXA      TAKE these medications       bimatoprost 0.01 % Soln  Commonly known as:  LUMIGAN  Place 1 drop into both eyes at bedtime.     cetirizine 10 MG tablet  Commonly known as:  ZYRTEC  Take 10 mg by mouth daily.     DSS  100 MG Caps  Take 100 mg by mouth 2 (two) times daily.     feeding supplement (ENSURE COMPLETE) Liqd  Take 237 mLs by mouth 2 (two) times daily between meals.     feeding supplement (RESOURCE BREEZE) Liqd  Take 1 Container by mouth 2 (two) times daily with a meal.     folic acid 1 MG tablet  Commonly known as:  FOLVITE  Take 1 tablet (1 mg total) by mouth daily.     meclizine 12.5 MG tablet  Commonly known as:  ANTIVERT  Take 1 tablet (12.5 mg total) by mouth 3 (three) times daily as needed.     methotrexate 2.5 MG tablet  Commonly known as:  RHEUMATREX  Take 10 mg by mouth once a week. On friday     omeprazole 20 MG capsule  Commonly known as:  PRILOSEC  Take 20 mg by mouth daily.     Vitamin D 2000 UNITS Caps  Take 2,000 Units by mouth daily.       Follow-up Information   Follow up with [facility]. Schedule an appointment as soon as possible for a visit in 1 week.   Specialty:  Pulmonology   Contact information:   [address] [place] [address] [phone]      Follow up with Jordan Barre, MD In 2 weeks.   Specialties:  Internal Medicine, Radiology   Contact information:   [address] [place] [address] [phone]       The results of significant diagnostics from this hospitalization (including imaging, microbiology, ancillary and laboratory) are listed below for reference.    Significant Diagnostic Studies: Dg Chest 1 View  05/11/2013   CLINICAL DATA:  Status post right-sided thoracentesis. Healing rib fractures.  EXAM: CHEST - 1 VIEW  COMPARISON:  Chest x-ray 05/10/2013.  FINDINGS: Compared to the prior chest x-ray, the previously noted right-sided pleural effusion has decreased in size, but remains moderate. There has been some concurrent re-expansion of the right middle and lower lobe, although there a continues to be extensive opacities throughout the right mid to lower lung, likely to represent areas of passive atelectasis  (  underlying airspace consolidation is not excluded). Left lung is clear. No pneumothorax. No evidence of pulmonary edema. Heart size is normal. Mediastinal contours are distorted by patient positioning.  IMPRESSION: 1. Decreased size of right-sided pleural effusion following thoracentesis. No pneumothorax.   Electronically Signed   By: Trudie Reed M.D.   On: 05/11/2013 18:14   Dg Chest 1 View  05/10/2013   CLINICAL DATA:  Post right thoracentesis  EXAM: CHEST - 1 VIEW  COMPARISON:  05/10/2013 at 1108 hr  FINDINGS: Enlargement of cardiac silhouette.  Tortuous aorta.  Moderate right pleural effusion decreased since previous exam.  No pneumothorax.  Persistent right basilar atelectasis.  Diffuse osseous demineralization.  IMPRESSION: Decrease in right pleural effusion and right basilar atelectasis post pneumothorax though moderate right pleural effusion remains.  No pneumothorax post thoracentesis.   Electronically Signed   By: Ulyses Southward M.D.   On: 05/10/2013 17:08   Dg Chest 2 View  05/10/2013   CLINICAL DATA:  Coughing.  EXAM: CHEST  2 VIEW  COMPARISON:  05/04/2013.  03/07/2013.  FINDINGS: Mediastinum is normal. Progressive right pleural fluid collection is present occupying almost the entire right hemithorax. Given patient's history of prior right 10th rib fracture this could represent a developing hemothorax as noted on prior report. CT can be obtained for further evaluation if clinically indicated. Left lung is clear. No pneumothorax. Heart size is normal. Lower right ribs not imaged on today's exam.  IMPRESSION: 1. Progressive right pleural fluid collection. Right pleural fluid collection occupies almost the entire right hemithorax. Given patient's history of prior rib fracture the possibility of developing hemothorax cannot be excluded. Chest CT can be obtained for further evaluation as clinically indicated. 2. No evidence of pneumothorax. Lower right ribs not imaged on today's exam . These  results will be called to the ordering clinician or representative by the Radiologist Assistant, and communication documented in the PACS Dashboard.   Electronically Signed   By: Maisie Fus  Register   On: 05/10/2013 11:29   Dg Chest 2 View  05/04/2013   CLINICAL DATA:  Week, dizzy -fall 8 weeks ago with rib fractures  EXAM: CHEST  2 VIEW  COMPARISON:  Prior chest x-ray 03/07/2013  FINDINGS: Interval development of a large right pleural effusion. Right 8th rib fracture again noted. Visualized cardiac and mediastinal structures are otherwise unchanged. Atherosclerotic calcification noted in the transverse aorta. The left lung is clear.  IMPRESSION: Interval development of a large right pleural effusion. In the setting of recent rib fracture, this may represent a sub acute hemothorax.  Consider further evaluation with ultrasound to assess for simple versus complex pleural fluid. Additionally, thoracentesis would likely be beneficial.  Aortic atherosclerosis.   Electronically Signed   By: Malachy Moan M.D.   On: 05/04/2013 17:20   Ct Head Wo Contrast  05/10/2013   CLINICAL DATA:  Increase memory loss.  Mild confusion.  EXAM: CT HEAD WITHOUT CONTRAST  TECHNIQUE: Contiguous axial images were obtained from the base of the skull through the vertex without intravenous contrast.  COMPARISON:  None.  FINDINGS: No intracranial hemorrhage.  Small vessel disease type changes without CT evidence of large acute infarct.  Atrophy with ventricular prominence felt to be related to atrophy rather than hydrocephalus.  No intracranial mass lesion noted on this unenhanced exam.  Vascular calcifications.  Prior surgery involving the right globe.  IMPRESSION: No acute abnormality.  Please see above   Electronically Signed   By: Kandice Hams.D.  On: 05/10/2013 19:16   Ct Chest Wo Contrast  05/10/2013   CLINICAL DATA:  Shortness of breath.  Coughing.  EXAM: CT CHEST WITHOUT CONTRAST  TECHNIQUE: Multidetector CT imaging of the  chest was performed following the standard protocol without IV contrast.  COMPARISON:  None.  FINDINGS: Progressive right pleural low attenuation fluid collection which now occupies almost the entire right hemithorax. Compressed lung centrally without visible infiltrate. Mild right-to-left mediastinal shift. Some residual aerated lung in right apex. Clear left lung.  Displaced right 9th and 10th rib fractures. Nondisplaced 11th and 12th rib fractures. Granulomatous calcified mediastinal lymph nodes. 3 vessel coronary artery calcification. No pericardial effusion. No left pleural effusion. No vertebral body compression fracture. Unremarkable appearing upper abdominal viscera. Negative chest wall.  IMPRESSION: Multiple subacute right 9-12 rib fractures without healing. Large low-attenuation right pleural effusion, not consistent with hemothorax. Mild right to left mediastinal shift. Significant compression of the underlying right lung. Thoracentesis recommended for further evaluation.   Electronically Signed   By: Davonna Belling M.D.   On: 05/10/2013 14:42   US Thoracentesis Asp Pleural Space W/img Guide  05/11/2013   CLINICAL DATA:  Patient with history of fall at home with right rib fractures, right pleural effusion. Request is made for therapeutic right thoracentesis  EXAM: ULTRASOUND GUIDED THERAPEUTIC RIGHT THORACENTESIS  COMPARISON:  PREVIOUS THORACENTESIS ON 05/10/2013.  FINDINGS: A total of approximately 1.1 liters of bloody fluid was removed.  IMPRESSION: Successful ultrasound guided therapeutic right thoracentesis yielding 1.1 liters of pleural fluid.  Read by: Jeananne Rama ,P.A.-C.  PROCEDURE: An ultrasound guided thoracentesis was thoroughly discussed with the patient and questions answered. The benefits, risks, alternatives and complications were also discussed. The patient understands and wishes to proceed with the procedure. Written consent was obtained.  Ultrasound was performed to localize and mark  an adequate pocket of fluid in the right chest. The area was then prepped and draped in the normal sterile fashion. 1% Lidocaine was used for local anesthesia. Under ultrasound guidance a 19 gauge Yueh catheter was introduced. Thoracentesis was performed. The catheter was removed and a dressing applied.  Complications:  None immediate   Electronically Signed   By: Irish Lack M.D.   On: 05/11/2013 17:47    Microbiology: Recent Results (from the past 240 hour(s))  URINE CULTURE     Status: None   Collection Time    05/04/13  4:38 PM      Result Value Range Status   Organism ID, Bacteria 4,000 COLONIES/ML Insignificant Growth   Final  URINE CULTURE     Status: None   Collection Time    05/10/13  2:09 PM      Result Value Range Status   Specimen Description URINE, CLEAN CATCH   Final   Special Requests NONE   Final   Culture  Setup Time     Final   Value: 05/10/2013 21:52     Performed at [facility] Count     Final   Value: NO GROWTH     Performed at [facility]   Culture     Final   Value: NO GROWTH     Performed at [facility]   Report Status 05/11/2013 FINAL   Final  BODY FLUID CULTURE     Status: None   Collection Time    05/10/13  4:26 PM      Result Value Range Status   Specimen Description PLEURAL RIGHT THORENCENTESIS  Final   Special Requests Immunocompromised   Final   Gram Stain     Final   Value: FEW WBC PRESENT,BOTH PMN AND MONONUCLEAR     NO ORGANISMS SEEN     Performed at [facility]   Culture     Final   Value: NO GROWTH 1 DAY     Performed at [facility]   Report Status PENDING   Incomplete     Labs: Basic Metabolic Panel:  Recent Labs Lab 05/10/13 1054 05/10/13 1441 05/11/13 0338 05/12/13 0355  NA 134*  --  134* 135  K 3.0*  --  3.4* 3.5  CL 98  --  101 102  CO2 25  --  24 24  GLUCOSE 116*  --  96 110*  BUN 14  --  15 17  CREATININE 0.61  --  0.62 0.63  CALCIUM 9.0  --  8.5 8.5  MG  --   2.0  --   --    Liver Function Tests: No results found for this basename: AST, ALT, ALKPHOS, BILITOT, PROT, ALBUMIN,  in the last 168 hours No results found for this basename: LIPASE, AMYLASE,  in the last 168 hours No results found for this basename: AMMONIA,  in the last 168 hours CBC:  Recent Labs Lab 05/10/13 1054 05/10/13 1731 05/10/13 2302 05/11/13 0338 05/12/13 0355  WBC 10.9* 8.7 10.3 9.8 9.7  NEUTROABS 9.0*  --   --   --   --   HGB 11.6* 11.1* 11.2* 10.9* 11.3*  HCT 34.5* 33.5* 33.6* 33.4* 34.7*  MCV 90.6 90.8 90.8 90.8 91.1  PLT 294 278 260 267 276   Cardiac Enzymes:  Recent Labs Lab 05/10/13 1124  TROPONINI <0.30   BNP: BNP (last 3 results) No results found for this basename: PROBNP,  in the last 8760 hours CBG: No results found for this basename: GLUCAP,  in the last 168 hours  Time coordinating discharge: 45 minutes  Signed:  Renae Fickle  [facility] 05/12/2013, 12:26 PM

## 2013-05-13 LAB — BODY FLUID CULTURE

## 2013-05-13 MED ORDER — MEGESTROL ACETATE 400 MG/10ML PO SUSP
400.0000 mg | Freq: Every day | ORAL | Status: DC
  Administered 2013-05-13 – 2013-05-14 (×2): 400 mg via ORAL
  Filled 2013-05-13 (×2): qty 10

## 2013-05-13 MED ORDER — CITALOPRAM HYDROBROMIDE 10 MG PO TABS
10.0000 mg | ORAL_TABLET | Freq: Every day | ORAL | Status: DC
  Administered 2013-05-13 – 2013-05-14 (×2): 10 mg via ORAL
  Filled 2013-05-13 (×2): qty 1

## 2013-05-13 MED ORDER — POLYETHYLENE GLYCOL 3350 17 G PO PACK
17.0000 g | PACK | Freq: Every day | ORAL | Status: DC | PRN
  Filled 2013-05-13: qty 1

## 2013-05-13 MED ORDER — SODIUM CHLORIDE 0.9 % IV SOLN
INTRAVENOUS | Status: DC
  Administered 2013-05-13 – 2013-05-14 (×2): via INTRAVENOUS

## 2013-05-13 NOTE — Progress Notes (Signed)
[facility] PROGRESS NOTE  Jordan Henderson ZOX:[id number] DOB: 11/27/1930 DOA: 05/10/2013 PCP: Oliver Barre, MD  Assessment/Plan  Right pleural effusion. Ms. Dunk underwent thoracentesis by radiology at the time of admission with removal of 1.5L of bright red fluid. Tests demonstrated HCT < 2, cell count WBC 73, protein 3.9, glucose 87, pf LDH 244. Light's criteria were positive, LDH ratio of 0.9, protein ratio 0.56, but not profoundly so. Her glucose is normal and pH was 8. She had few inflammatory cells on smear and culture remained negative. Most likely she had a small hemothorax post fall and developed a reactive effusion which may have been exacerbated by her underlying rheumatologic disorder.  Dr. Sung Amabile from Pulmonology who recommended repeating her thoracentesis to drain residual fluid. Radiology drained an additional 1L from her right chest with a small amount of effusion remaining post-procedure and no pneumothorax. Her SOB drastically improved after her second thoracentesis and although she remained weak, she was able to stand and transfer without dyspnea whereas previously she was SOB at rest.  -  Thoracentesis culture NGTD -  Cytology pending  Dehydration:  Only drank of fluid yesterday, uop yesterday, and BUN rising -  Start IVF -  Encouraged PO intake, family to assist  Moderate protein-calorie malnutrition and not eating. She was seen by nutrition and started on regular diet and dietary supplements.  -  Start megace  Normocytic anemia: Hemoglobin remained stable near 11mg /dl.   Progressive weakness may be secondary to the increased exertion she had compensating for the large pleural effusion on the right. She did not have progressive anemia. Her urine culture was negative and she did not have evidence of pneumonia on CXR. PT/OT recommended SNF.   No urinary tract infection. Culture neg.   Mild dementia, having some memory difficulties and mild confusion  without agitation. Her mother suffered from dementia. CT head demonstrates atrophy. TSH wnl, vit B12 wnl, RPR NR.   Depression.  Restart celexa.  Rheumatoid arthritis. Continue to hold methotrexate  Diet:  Regular  Access:  PIV IVF:  yes Proph:  lovenox  Code Status: DNR Family Communication: patient and her daughter Disposition Plan:  Likely to SNF tomorrow   Consultants:  Pulmonology, Dr. Sung Amabile by phone  Radiology Procedures:  Thoracentesis 11/20 and again on 11/21  CXR  CT chest  CT head Antibiotics:  None    HPI/Subjective:  States she still feels weak.  Denies SOB, chest pain, nausea.  1 BM recorded yesterday and very little PO intake or uop.    Objective: Filed Vitals:   05/12/13 0536 05/12/13 1429 05/12/13 2118 05/13/13 0523  BP: 151/65 135/72 143/69 139/62  Pulse: 80 97 95 88  Temp: 98.2 F (36.8 C) 97.3 F (36.3 C) 97.7 F (36.5 C) 98.5 F (36.9 C)  TempSrc: Oral Oral Oral Oral  Resp: 18 24 20 20   Height:      Weight:      SpO2: 96% 97% 97% 97%    Intake/Output Summary (Last 24 hours) at 05/13/13 1302 Last data filed at 05/13/13 1154  Gross per 24 hour  Intake    260 ml  Output    385 ml  Net   -125 ml   Filed Weights   05/10/13 1712  Weight: 58 kg (127 lb 13.9 oz)    Exam:   General:  CF, No acute distress, sitting in chair  HEENT:  NCAT, MMM  Cardiovascular:  RRR, nl S1, S2 2/6 systolic murmur LSB,  no rg, 2+ pulses, warm extremities   Respiratory:  Left side clear. Diminished breath sounds at the right base with rales at the right base without wheezes. Rare rhonchi. No increased WOB   Abdomen:   NABS, soft, NT/ND  MSK:   Normal tone and bulk, no LEE  Neuro:  Grossly intact  Data Reviewed: Basic Metabolic Panel:  Recent Labs Lab 05/10/13 1054 05/10/13 1441 05/11/13 0338 05/12/13 0355  NA 134*  --  134* 135  K 3.0*  --  3.4* 3.5  CL 98  --  101 102  CO2 25  --  24 24  GLUCOSE 116*  --  96 110*  BUN 14  --  15 17   CREATININE 0.61  --  0.62 0.63  CALCIUM 9.0  --  8.5 8.5  MG  --  2.0  --   --    Liver Function Tests: No results found for this basename: AST, ALT, ALKPHOS, BILITOT, PROT, ALBUMIN,  in the last 168 hours No results found for this basename: LIPASE, AMYLASE,  in the last 168 hours No results found for this basename: AMMONIA,  in the last 168 hours CBC:  Recent Labs Lab 05/10/13 1054 05/10/13 1731 05/10/13 2302 05/11/13 0338 05/12/13 0355  WBC 10.9* 8.7 10.3 9.8 9.7  NEUTROABS 9.0*  --   --   --   --   HGB 11.6* 11.1* 11.2* 10.9* 11.3*  HCT 34.5* 33.5* 33.6* 33.4* 34.7*  MCV 90.6 90.8 90.8 90.8 91.1  PLT 294 278 260 267 276   Cardiac Enzymes:  Recent Labs Lab 05/10/13 1124  TROPONINI <0.30   BNP (last 3 results) No results found for this basename: PROBNP,  in the last 8760 hours CBG: No results found for this basename: GLUCAP,  in the last 168 hours  Recent Results (from the past 240 hour(s))  URINE CULTURE     Status: None   Collection Time    05/04/13  4:38 PM      Result Value Range Status   Organism ID, Bacteria 4,000 COLONIES/ML Insignificant Growth   Final  URINE CULTURE     Status: None   Collection Time    05/10/13  2:09 PM      Result Value Range Status   Specimen Description URINE, CLEAN CATCH   Final   Special Requests NONE   Final   Culture  Setup Time     Final   Value: 05/10/2013 21:52     Performed at [facility] Count     Final   Value: NO GROWTH     Performed at [facility]   Culture     Final   Value: NO GROWTH     Performed at [facility]   Report Status 05/11/2013 FINAL   Final  BODY FLUID CULTURE     Status: None   Collection Time    05/10/13  4:26 PM      Result Value Range Status   Specimen Description PLEURAL RIGHT THORENCENTESIS   Final   Special Requests Immunocompromised   Final   Gram Stain     Final   Value: FEW WBC PRESENT,BOTH PMN AND MONONUCLEAR     NO ORGANISMS SEEN     Performed  at [facility]   Culture     Final   Value: NO GROWTH 2 DAYS     Performed at [facility]   Report Status PENDING  Incomplete     Studies: Dg Chest 1 View  05/11/2013   CLINICAL DATA:  Status post right-sided thoracentesis. Healing rib fractures.  EXAM: CHEST - 1 VIEW  COMPARISON:  Chest x-ray 05/10/2013.  FINDINGS: Compared to the prior chest x-ray, the previously noted right-sided pleural effusion has decreased in size, but remains moderate. There has been some concurrent re-expansion of the right middle and lower lobe, although there a continues to be extensive opacities throughout the right mid to lower lung, likely to represent areas of passive atelectasis (underlying airspace consolidation is not excluded). Left lung is clear. No pneumothorax. No evidence of pulmonary edema. Heart size is normal. Mediastinal contours are distorted by patient positioning.  IMPRESSION: 1. Decreased size of right-sided pleural effusion following thoracentesis. No pneumothorax.   Electronically Signed   By: Trudie Reed M.D.   On: 05/11/2013 18:14   US Thoracentesis Asp Pleural Space W/img Guide  05/11/2013   CLINICAL DATA:  Patient with history of fall at home with right rib fractures, right pleural effusion. Request is made for therapeutic right thoracentesis  EXAM: ULTRASOUND GUIDED THERAPEUTIC RIGHT THORACENTESIS  COMPARISON:  PREVIOUS THORACENTESIS ON 05/10/2013.  FINDINGS: A total of approximately 1.1 liters of bloody fluid was removed.  IMPRESSION: Successful ultrasound guided therapeutic right thoracentesis yielding 1.1 liters of pleural fluid.  Read by: Jeananne Rama ,P.A.-C.  PROCEDURE: An ultrasound guided thoracentesis was thoroughly discussed with the patient and questions answered. The benefits, risks, alternatives and complications were also discussed. The patient understands and wishes to proceed with the procedure. Written consent was obtained.  Ultrasound was performed to  localize and mark an adequate pocket of fluid in the right chest. The area was then prepped and draped in the normal sterile fashion. 1% Lidocaine was used for local anesthesia. Under ultrasound guidance a 19 gauge Yueh catheter was introduced. Thoracentesis was performed. The catheter was removed and a dressing applied.  Complications:  None immediate   Electronically Signed   By: Irish Lack M.D.   On: 05/11/2013 17:47    Scheduled Meds: . cholecalciferol  2,000 Units Oral Daily  . citalopram  10 mg Oral Daily  . docusate sodium  100 mg Oral BID  . feeding supplement (ENSURE COMPLETE)  237 mL Oral BID BM  . feeding supplement (RESOURCE BREEZE)  1 Container Oral BID WC  . folic acid  1 mg Oral Daily  . latanoprost  1 drop Both Eyes QHS  . loratadine  10 mg Oral Daily  . megestrol  400 mg Oral Daily  . pantoprazole  40 mg Oral Daily  . senna  1 tablet Oral BID  . sodium chloride  3 mL Intravenous Q12H   Continuous Infusions: . sodium chloride      Principal Problem:   Hemothorax on right Active Problems:   HYPERTENSION   ALLERGIC RHINITIS   Falls   Rib fracture   Chronic rheumatic arthritis   Depression   Leukocytosis   Normocytic anemia   Hypokalemia   Hyponatremia    Time spent: 30 min    Zakya Halabi, [facility]  [facility] Pager [phone]. If 7PM-7AM, please contact night-coverage at [email or web address], password [email or web address] 05/13/2013, 1:02 PM  LOS: 3 days

## 2013-05-13 NOTE — Progress Notes (Signed)
Spoke with Pt's son via phone.    Pt's son stated that they are leaning towards [facility].  They intend to tour that facility today.    Weekday CSW to follow tomorrow.  CSW thanked Mr. Garant for his time.  Providence Crosby, LCSWA Clinical Social Work [phone]

## 2013-05-14 DIAGNOSIS — D72829 Elevated white blood cell count, unspecified: Secondary | ICD-10-CM

## 2013-05-14 DIAGNOSIS — E876 Hypokalemia: Secondary | ICD-10-CM

## 2013-05-14 DIAGNOSIS — E871 Hypo-osmolality and hyponatremia: Secondary | ICD-10-CM

## 2013-05-14 MED ORDER — CITALOPRAM HYDROBROMIDE 10 MG PO TABS: 10.0000 mg | ORAL_TABLET | Freq: Every day | ORAL | Status: AC

## 2013-05-14 MED ORDER — MEGESTROL ACETATE 400 MG/10ML PO SUSP: 400.0000 mg | Freq: Every day | ORAL | Status: AC

## 2013-05-14 MED ORDER — POLYETHYLENE GLYCOL 3350 17 G PO PACK: 17.0000 g | PACK | Freq: Every day | ORAL | Status: AC | PRN

## 2013-05-14 NOTE — Discharge Summary (Signed)
Physician Discharge Summary  Jordan Henderson JYN:[id number] DOB: 24-Jan-1931 DOA: 05/10/2013  PCP: Jordan Barre, MD  Admit date: 05/10/2013 Discharge date: 05/14/2013  Recommendations for Outpatient Follow-up:   Right hemothorax/S/P thoracentesis x2; followup with Dr. Sandrea Hughs on December 1 at [address]  [facility]. Her thoracentesis culture are negative   Discharge Diagnoses:  Principal Problem:   Hemothorax on right Active Problems:   HYPERTENSION   ALLERGIC RHINITIS   Falls   Rib fracture   Chronic rheumatic arthritis   Depression   Leukocytosis   Normocytic anemia   Hypokalemia   Hyponatremia   Discharge Condition: stable, improve  Diet recommendation: regular  Wt Readings from Last 3 Encounters:  05/10/13 58 kg (127 lb 13.9 oz)  05/04/13 59.875 kg (132 lb)  03/09/13 62.823 kg (138 lb 8 oz)    History of present illness:   The patient is a 79 y.o WF PMHx diet controlled HTN and HLD, GERD, rheumatoid arthritis on immunosuppression who presents with progressive SOB, cough, weakness, and memory problems. The patient was last at their baseline health several months ago. About two months ago, she had a fall and broke several ribs on the right chest. Her diagnostic CXR demonstrated no pleural effusion. About one month ago, she had another fall and injured her right shoulder. Since that time, she has had progressive weakness. As of 4 months ago, she was living independently, however, over the last couple of weaks, she has needed assistance to get out of bed and get to the bathroom. She has been coughing frequently and over the last three weeks, she has been short of breath with minimal exertion. Additionally, her family has noticed that she has had increases short term memory problems. For example, sometimes, she thinks she is at a hotel even though she is in a house she has known for 30 years. A week ago, she was seen by her PCP, who repeated her CXR which demonstrated a large  right pleural effusion (05/04/2013) and she was referred to pulmonology. She actually had an appointment scheduled for today, but due to her progressive SOB, her family brought her to the ER. Of note, she smoked "but did not inhale" many years ago and there is no family history of lung cancer. She was on aspirin, but stopped more than 3 months ago, even before her first fall, and has not been on any other blood thinners.   In the ER, her VS were stable. Her labs were notable for WBC 10.9, hgb 11.6, plt 294, Na 134, potassium 3, creatinine 0.61. UA with SG of 1.029, ketones, 11-20 WBC. Her CXR demonstrated a large right pleural effusion and follow up CT chest demonstrated a large low-attenuation right pleural effusion with mild right to left mediastinal shift and significant compression of the underlying right lung. She is being admitted for management of progressive pleural effusion with SOB and progressive weakness.   Prior to admission I spoke with Pulmonology and with Radiology. She underwent diagnostic and therapeutic thoracentesis with removal of 2.5L of bright bloody fluid. I have changed her admission from telemetry to stepdown in the event that she is having active bleeding.    Hospital Course:   Right pleural effusion.  [name] underwent thoracentesis by radiology at the time of admission with removal of 2.5L of bright red fluid.  Tests demonstrated HCT < 2, cell count WBC 73, protein 3.9, glucose 87, pf LDH 244.  Light's criteria were positive, LDH ratio of 0.9, protein ratio  0.56, but not profoundly so. Her glucose is normal and pH was 8. She had few inflammatory cells on smear and culture remained negative.  Most likely she had a small hemothorax post fall and developed a reactive effusion which may have been exacerbated by her underlying rheumatologic disorder.  I spoke with Dr. Sung Amabile from Pulmonology who recommended repeating her thoracentesis to drain residual fluid.  Radiology drained  an additional 1L from her right chest with a small amount of effusion remaining post-procedure and no pneumothorax.  Her SOB drastically improved after her second thoracentesis and although she remained weak, she was able to stand and transfer without dyspnea whereas previously she was SOB at rest.   Hemothorax right side; result after 2.5 L exudative removed by thoracentesis  Frequent fall; patient will be discharged to SNF for strengthening and range of motion training  Normocytic anemia:   Hemoglobin remained stable 11.3mg /dl.      Progressive weakness; patient was up and moving around today to go to and from bathroom, however still will require rehabilitation at SNF.    Mild dementia, currently A./O. x4 CT head demonstrates atrophy.  TSH wnl, vit B12 wnl, RPR NR.  Depression. Will cont. Celexa 10 mg daily .  Leukocytosis, resolved    Mild hyponatremia and hypokalemia resolved     Moderate protein-calorie malnutrition.  Continue megace.    Rheumatoid arthritis.  Continue hold methotrexate and allow patient's PCP to restart       Consultants:  Pulmonology, Dr. Sung Amabile by phone Radiology Procedures:  Thoracentesis 11/20 and again on 11/21; culture is negative for organisms (final) CXR  CT chest  CT head Urine culture 05/04/2013 negative (final) Antibiotics:  None    Discharge Exam: Filed Vitals:   05/14/13 0500  BP: 154/67  Pulse: 77  Temp: 98.6 F (37 C)  Resp: 18   Filed Vitals:   05/13/13 1330 05/13/13 2047 05/13/13 2102 05/14/13 0500  BP: 135/71 138/73 146/87 154/67  Pulse: 89 87 108 77  Temp: 98.1 F (36.7 C) 98.2 F (36.8 C) 97.7 F (36.5 C) 98.6 F (37 C)  TempSrc: Oral Oral Oral Oral  Resp: 19 19 18 18   Height:      Weight:      SpO2: 98% 98% 98% 97%    General: A./O. x4, NAD Thin frail   Cardiovascular: RRR, negative murmurs rubs or gallops, DP/PT pulse one plus bilateral   Respiratory: Left side clear.  Right upper extremity right middle  lobe clear to auscultation, crackles right lower lobe  No increased WOB  Abdomen: NABS, soft, NT/ND  MSK: Normal tone and bulk, no LEE  Neuro: Grossly intact   Discharge Instructions      Discharge Orders   Future Appointments Provider Department Dept Phone   06/01/2013 2:45 PM Corwin Levins, MD [facility] [place] [phone]   06/29/2013 9:30 AM Corwin Levins, MD [facility] [phone]   Future Orders Complete By Expires   Call MD for:  difficulty breathing, headache or visual disturbances  As directed    Call MD for:  extreme fatigue  As directed    Call MD for:  hives  As directed    Call MD for:  persistant dizziness or light-headedness  As directed    Call MD for:  persistant nausea and vomiting  As directed    Call MD for:  severe uncontrolled pain  As directed    Call MD for:  temperature >100.4  As directed    Diet general  As directed    Increase activity slowly  As directed        Medication List    STOP taking these medications       citalopram 10 MG tablet  Commonly known as:  CELEXA      TAKE these medications       bimatoprost 0.01 % Soln  Commonly known as:  LUMIGAN  Place 1 drop into both eyes at bedtime.     cetirizine 10 MG tablet  Commonly known as:  ZYRTEC  Take 10 mg by mouth daily.     DSS 100 MG Caps  Take 100 mg by mouth 2 (two) times daily.     feeding supplement (ENSURE COMPLETE) Liqd  Take 237 mLs by mouth 2 (two) times daily between meals.     feeding supplement (RESOURCE BREEZE) Liqd  Take 1 Container by mouth 2 (two) times daily with a meal.     folic acid 1 MG tablet  Commonly known as:  FOLVITE  Take 1 tablet (1 mg total) by mouth daily.     meclizine 12.5 MG tablet  Commonly known as:  ANTIVERT  Take 1 tablet (12.5 mg total) by mouth 3 (three) times daily as needed.     methotrexate 2.5 MG tablet  Commonly known as:  RHEUMATREX  Take 10 mg by mouth once a week. On friday      omeprazole 20 MG capsule  Commonly known as:  PRILOSEC  Take 20 mg by mouth daily.     Vitamin D 2000 UNITS Caps  Take 2,000 Units by mouth daily.       Follow-up Information   Follow up with [facility]. Schedule an appointment as soon as possible for a visit in 1 week.   Specialty:  Pulmonology   Contact information:   [address] [place] [address] [phone]      Follow up with Jordan Barre, MD In 2 weeks.   Specialties:  Internal Medicine, Radiology   Contact information:   [address] [place] [address] [phone]       The results of significant diagnostics from this hospitalization (including imaging, microbiology, ancillary and laboratory) are listed below for reference.    Significant Diagnostic Studies: Dg Chest 1 View  05/11/2013   CLINICAL DATA:  Status post right-sided thoracentesis. Healing rib fractures.  EXAM: CHEST - 1 VIEW  COMPARISON:  Chest x-ray 05/10/2013.  FINDINGS: Compared to the prior chest x-ray, the previously noted right-sided pleural effusion has decreased in size, but remains moderate. There has been some concurrent re-expansion of the right middle and lower lobe, although there a continues to be extensive opacities throughout the right mid to lower lung, likely to represent areas of passive atelectasis (underlying airspace consolidation is not excluded). Left lung is clear. No pneumothorax. No evidence of pulmonary edema. Heart size is normal. Mediastinal contours are distorted by patient positioning.  IMPRESSION: 1. Decreased size of right-sided pleural effusion following thoracentesis. No pneumothorax.   Electronically Signed   By: Trudie Reed M.D.   On: 05/11/2013 18:14   Dg Chest 1 View  05/10/2013   CLINICAL DATA:  Post right thoracentesis  EXAM: CHEST - 1 VIEW  COMPARISON:  05/10/2013 at 1108 hr  FINDINGS: Enlargement of cardiac silhouette.  Tortuous aorta.  Moderate right pleural effusion decreased  since previous exam.  No pneumothorax.  Persistent right basilar atelectasis.  Diffuse  osseous demineralization.  IMPRESSION: Decrease in right pleural effusion and right basilar atelectasis post pneumothorax though moderate right pleural effusion remains.  No pneumothorax post thoracentesis.   Electronically Signed   By: Ulyses Southward M.D.   On: 05/10/2013 17:08   Dg Chest 2 View  05/10/2013   CLINICAL DATA:  Coughing.  EXAM: CHEST  2 VIEW  COMPARISON:  05/04/2013.  03/07/2013.  FINDINGS: Mediastinum is normal. Progressive right pleural fluid collection is present occupying almost the entire right hemithorax. Given patient's history of prior right 10th rib fracture this could represent a developing hemothorax as noted on prior report. CT can be obtained for further evaluation if clinically indicated. Left lung is clear. No pneumothorax. Heart size is normal. Lower right ribs not imaged on today's exam.  IMPRESSION: 1. Progressive right pleural fluid collection. Right pleural fluid collection occupies almost the entire right hemithorax. Given patient's history of prior rib fracture the possibility of developing hemothorax cannot be excluded. Chest CT can be obtained for further evaluation as clinically indicated. 2. No evidence of pneumothorax. Lower right ribs not imaged on today's exam . These results will be called to the ordering clinician or representative by the Radiologist Assistant, and communication documented in the PACS Dashboard.   Electronically Signed   By: Maisie Fus  Register   On: 05/10/2013 11:29   Dg Chest 2 View  05/04/2013   CLINICAL DATA:  Week, dizzy -fall 8 weeks ago with rib fractures  EXAM: CHEST  2 VIEW  COMPARISON:  Prior chest x-ray 03/07/2013  FINDINGS: Interval development of a large right pleural effusion. Right 8th rib fracture again noted. Visualized cardiac and mediastinal structures are otherwise unchanged. Atherosclerotic calcification noted in the transverse aorta. The left  lung is clear.  IMPRESSION: Interval development of a large right pleural effusion. In the setting of recent rib fracture, this may represent a sub acute hemothorax.  Consider further evaluation with ultrasound to assess for simple versus complex pleural fluid. Additionally, thoracentesis would likely be beneficial.  Aortic atherosclerosis.   Electronically Signed   By: Malachy Moan M.D.   On: 05/04/2013 17:20   Ct Head Wo Contrast  05/10/2013   CLINICAL DATA:  Increase memory loss.  Mild confusion.  EXAM: CT HEAD WITHOUT CONTRAST  TECHNIQUE: Contiguous axial images were obtained from the base of the skull through the vertex without intravenous contrast.  COMPARISON:  None.  FINDINGS: No intracranial hemorrhage.  Small vessel disease type changes without CT evidence of large acute infarct.  Atrophy with ventricular prominence felt to be related to atrophy rather than hydrocephalus.  No intracranial mass lesion noted on this unenhanced exam.  Vascular calcifications.  Prior surgery involving the right globe.  IMPRESSION: No acute abnormality.  Please see above   Electronically Signed   By: Bridgett Larsson M.D.   On: 05/10/2013 19:16   Ct Chest Wo Contrast  05/10/2013   CLINICAL DATA:  Shortness of breath.  Coughing.  EXAM: CT CHEST WITHOUT CONTRAST  TECHNIQUE: Multidetector CT imaging of the chest was performed following the standard protocol without IV contrast.  COMPARISON:  None.  FINDINGS: Progressive right pleural low attenuation fluid collection which now occupies almost the entire right hemithorax. Compressed lung centrally without visible infiltrate. Mild right-to-left mediastinal shift. Some residual aerated lung in right apex. Clear left lung.  Displaced right 9th and 10th rib fractures. Nondisplaced 11th and 12th rib fractures. Granulomatous calcified mediastinal lymph nodes. 3 vessel coronary artery calcification. No pericardial effusion. No left  pleural effusion. No vertebral body compression  fracture. Unremarkable appearing upper abdominal viscera. Negative chest wall.  IMPRESSION: Multiple subacute right 9-12 rib fractures without healing. Large low-attenuation right pleural effusion, not consistent with hemothorax. Mild right to left mediastinal shift. Significant compression of the underlying right lung. Thoracentesis recommended for further evaluation.   Electronically Signed   By: Davonna Belling M.D.   On: 05/10/2013 14:42   US Thoracentesis Asp Pleural Space W/img Guide  05/11/2013   CLINICAL DATA:  Patient with history of fall at home with right rib fractures, right pleural effusion. Request is made for therapeutic right thoracentesis  EXAM: ULTRASOUND GUIDED THERAPEUTIC RIGHT THORACENTESIS  COMPARISON:  PREVIOUS THORACENTESIS ON 05/10/2013.  FINDINGS: A total of approximately 1.1 liters of bloody fluid was removed.  IMPRESSION: Successful ultrasound guided therapeutic right thoracentesis yielding 1.1 liters of pleural fluid.  Read by: Jeananne Rama ,P.A.-C.  PROCEDURE: An ultrasound guided thoracentesis was thoroughly discussed with the patient and questions answered. The benefits, risks, alternatives and complications were also discussed. The patient understands and wishes to proceed with the procedure. Written consent was obtained.  Ultrasound was performed to localize and mark an adequate pocket of fluid in the right chest. The area was then prepped and draped in the normal sterile fashion. 1% Lidocaine was used for local anesthesia. Under ultrasound guidance a 19 gauge Yueh catheter was introduced. Thoracentesis was performed. The catheter was removed and a dressing applied.  Complications:  None immediate   Electronically Signed   By: Irish Lack M.D.   On: 05/11/2013 17:47    Microbiology: Recent Results (from the past 240 hour(s))  URINE CULTURE     Status: None   Collection Time    05/04/13  4:38 PM      Result Value Range Status   Organism ID, Bacteria 4,000 COLONIES/ML  Insignificant Growth   Final  URINE CULTURE     Status: None   Collection Time    05/10/13  2:09 PM      Result Value Range Status   Specimen Description URINE, CLEAN CATCH   Final   Special Requests NONE   Final   Culture  Setup Time     Final   Value: 05/10/2013 21:52     Performed at [facility] Count     Final   Value: NO GROWTH     Performed at [facility]   Culture     Final   Value: NO GROWTH     Performed at [facility]   Report Status 05/11/2013 FINAL   Final  BODY FLUID CULTURE     Status: None   Collection Time    05/10/13  4:26 PM      Result Value Range Status   Specimen Description PLEURAL RIGHT THORENCENTESIS   Final   Special Requests Immunocompromised   Final   Gram Stain     Final   Value: FEW WBC PRESENT,BOTH PMN AND MONONUCLEAR     NO ORGANISMS SEEN     Performed at [facility]   Culture     Final   Value: NO GROWTH 3 DAYS     Performed at [facility]   Report Status 05/13/2013 FINAL   Final     Labs: Basic Metabolic Panel:  Recent Labs Lab 05/10/13 1054 05/10/13 1441 05/11/13 0338 05/12/13 0355  NA 134*  --  134* 135  K 3.0*  --  3.4* 3.5  CL 98  --  101 102  CO2 25  --  24 24  GLUCOSE 116*  --  96 110*  BUN 14  --  15 17  CREATININE 0.61  --  0.62 0.63  CALCIUM 9.0  --  8.5 8.5  MG  --  2.0  --   --    Liver Function Tests: No results found for this basename: AST, ALT, ALKPHOS, BILITOT, PROT, ALBUMIN,  in the last 168 hours No results found for this basename: LIPASE, AMYLASE,  in the last 168 hours No results found for this basename: AMMONIA,  in the last 168 hours CBC:  Recent Labs Lab 05/10/13 1054 05/10/13 1731 05/10/13 2302 05/11/13 0338 05/12/13 0355  WBC 10.9* 8.7 10.3 9.8 9.7  NEUTROABS 9.0*  --   --   --   --   HGB 11.6* 11.1* 11.2* 10.9* 11.3*  HCT 34.5* 33.5* 33.6* 33.4* 34.7*  MCV 90.6 90.8 90.8 90.8 91.1  PLT 294 278 260 267 276   Cardiac Enzymes:  Recent  Labs Lab 05/10/13 1124  TROPONINI <0.30   BNP: BNP (last 3 results) No results found for this basename: PROBNP,  in the last 8760 hours CBG: No results found for this basename: GLUCAP,  in the last 168 hours  Time coordinating discharge: 45 minutes  Signed:  Carolyne Littles, J  [facility] 05/14/2013, 11:08 AM

## 2013-05-14 NOTE — Progress Notes (Signed)
Pt for discharge to [facility].  CSW facilitated pt discharge needs including contacting facility, faxing pt discharge information via [facility], discussing with pt son at bedside, providing RN phone number to call report, and arranged ambulance transport for pt to [facility] (Service request ID#:  [id number]).  No further social work needs identified at this time.  CSW signing off.  Jacklynn Lewis, MSW, LCSWA  Clinical Social Work Coverage Unice Bailey [phone]

## 2013-05-16 LAB — MISCELLANEOUS TEST

## 2013-05-21 ENCOUNTER — Institutional Professional Consult (permissible substitution): Payer: Medicare Other | Admitting: Internal Medicine

## 2013-05-21 LAB — CHOLESTEROL, BODY FLUID

## 2013-05-21 NOTE — ED Provider Notes (Signed)
CSN: [id number]     Arrival date & time 05/10/13  1013 History   First MD Initiated Contact with Patient 05/10/13 1041     Chief Complaint  Patient presents with  . Dehydration  . possible plueral effusion    (Consider location/radiation/quality/duration/timing/severity/associated sxs/prior Treatment) HPI Comments: 79 yo female with multiple medical problems presents with worsening weakness for a few months, recent right rib fx, gradual worsening, mild confusion, known right effusion.  Urine odor new.  Nothing improves.  No head injuries.   No specific time association.  The history is provided by the patient and a relative.    Past Medical History  Diagnosis Date  . ALLERGIC RHINITIS 05/16/2007  . ANKLE PAIN, BILATERAL 07/24/2010  . Diarrhea 02/24/2010  . FATIGUE 08/28/2007  . GERD 05/16/2007  . GLUCOSE INTOLERANCE 09/02/2008  . HYPERLIPIDEMIA 01/18/2007  . HYPERTENSION 01/18/2007  . KNEE PAIN, LEFT 07/10/2009  . LEG PAIN, LEFT 07/24/2010  . OSTEOARTHRITIS, KNEES, BILATERAL 05/16/2007  . OSTEOPENIA 09/26/2007  . Pain in joint, multiple sites 07/24/2010  . SHOULDER IMPINGEMENT SYNDROME, LEFT 05/16/2007  . SHOULDER PAIN, RIGHT 07/10/2009  . SINUSITIS- ACUTE-NOS 05/29/2007  . TRIGGER FINGER, RIGHT MIDDLE 07/10/2009  . Impaired glucose tolerance 02/16/2011  . Blind right eye   . Glaucoma, left eye    Past Surgical History  Procedure Laterality Date  . Inguinal herniorrhapy      x3  . S/p left knee arthroscopy  march 2011    Dr. Ranell Patrick   Family History  Problem Relation Age of Onset  . Dementia Mother   . Stroke Father   . Hypertension Other    History  Substance Use Topics  . Smoking status: Former Games developer  . Smokeless tobacco: Never Used  . Alcohol Use: Yes   OB History   Grav Para Term Preterm Abortions TAB SAB Ect Mult Living                 Review of Systems  Constitutional: Positive for appetite change and fatigue. Negative for fever and chills.  HENT: Negative for  congestion.   Eyes: Negative for visual disturbance.  Respiratory: Negative for shortness of breath.   Cardiovascular: Negative for chest pain.  Gastrointestinal: Negative for vomiting and abdominal pain.  Genitourinary: Positive for flank pain. Negative for dysuria.  Musculoskeletal: Negative for neck pain and neck stiffness.  Skin: Negative for rash.  Neurological: Positive for weakness. Negative for light-headedness and headaches.  Psychiatric/Behavioral: Positive for confusion.    Allergies  Oxaprozin  Home Medications   Current Outpatient Rx  Name  Route  Sig  Dispense  Refill  . bimatoprost (LUMIGAN) 0.01 % SOLN   Both Eyes   Place 1 drop into both eyes at bedtime.          . cetirizine (ZYRTEC) 10 MG tablet   Oral   Take 10 mg by mouth daily.         . Cholecalciferol (VITAMIN D) 2000 UNITS CAPS   Oral   Take 2,000 Units by mouth daily.         . folic acid (FOLVITE) 1 MG tablet   Oral   Take 1 tablet (1 mg total) by mouth daily.   90 tablet   3   . meclizine (ANTIVERT) 12.5 MG tablet   Oral   Take 1 tablet (12.5 mg total) by mouth 3 (three) times daily as needed.   30 tablet   1   . omeprazole (PRILOSEC) 20 MG  capsule   Oral   Take 20 mg by mouth daily.         . citalopram (CELEXA) 10 MG tablet   Oral   Take 1 tablet (10 mg total) by mouth daily.   30 tablet   0   . docusate sodium 100 MG CAPS   Oral   Take 100 mg by mouth 2 (two) times daily.   10 capsule   0   . feeding supplement, ENSURE COMPLETE, (ENSURE COMPLETE) LIQD   Oral   Take 237 mLs by mouth 2 (two) times daily between meals.         . feeding supplement, RESOURCE BREEZE, (RESOURCE BREEZE) LIQD   Oral   Take 1 Container by mouth 2 (two) times daily with a meal.      0   . megestrol (MEGACE) 400 MG/10ML suspension   Oral   Take 10 mLs (400 mg total) by mouth daily.   240 mL   0   . polyethylene glycol (MIRALAX / GLYCOLAX) packet   Oral   Take 17 g by mouth  daily as needed for mild constipation.   14 each   0    BP 154/67  Pulse 77  Temp(Src) 98.6 F (37 C) (Oral)  Resp 18  Ht 5\' 1"  (1.549 m)  Wt 127 lb 13.9 oz (58 kg)  BMI 24.17 kg/m2  SpO2 97% Physical Exam  Nursing note and vitals reviewed. Constitutional: She appears well-developed and well-nourished.  HENT:  Head: Normocephalic and atraumatic.  Dry mm  Eyes: Right eye exhibits no discharge. Left eye exhibits no discharge.  Neck: Normal range of motion. Neck supple. No tracheal deviation present.  Cardiovascular: Normal rate and regular rhythm.   Pulmonary/Chest: No respiratory distress. She has no wheezes. She has rales.  decr breath sounds right side  Abdominal: Soft. She exhibits no distension. There is no tenderness. There is no guarding.  Musculoskeletal: She exhibits tenderness (tender right lateral lower rib). She exhibits no edema.  Neurological: She is alert.  General weakness  Skin: Skin is warm. No rash noted.  Psychiatric: She has a normal mood and affect.    ED Course  Procedures (including critical care time) Labs Review Labs Reviewed  CBC WITH DIFFERENTIAL - Abnormal; Notable for the following:    WBC 10.9 (*)    RBC 3.81 (*)    Hemoglobin 11.6 (*)    HCT 34.5 (*)    Neutrophils Relative % 83 (*)    Neutro Abs 9.0 (*)    Lymphocytes Relative 7 (*)    All other components within normal limits  BASIC METABOLIC PANEL - Abnormal; Notable for the following:    Sodium 134 (*)    Potassium 3.0 (*)    Glucose, Bld 116 (*)    GFR calc non Af Amer 83 (*)    All other components within normal limits  URINALYSIS, ROUTINE W REFLEX MICROSCOPIC - Abnormal; Notable for the following:    Color, Urine ORANGE (*)    Bilirubin Urine SMALL (*)    Ketones, ur 15 (*)    Protein, ur 30 (*)    Urobilinogen, UA >8.0 (*)    Leukocytes, UA SMALL (*)    All other components within normal limits  URINE MICROSCOPIC-ADD ON - Abnormal; Notable for the following:     Squamous Epithelial / LPF FEW (*)    Bacteria, UA FEW (*)    All other components within normal limits  BODY FLUID  CELL COUNT WITH DIFFERENTIAL - Abnormal; Notable for the following:    Color, Fluid RED (*)    Appearance, Fluid TURBID (*)    Monocyte-Macrophage-Serous Fluid 2 (*)    All other components within normal limits  LACTATE DEHYDROGENASE, BODY FLUID - Abnormal; Notable for the following:    LD, Fluid 244 (*)    All other components within normal limits  LACTATE DEHYDROGENASE - Abnormal; Notable for the following:    LDH 269 (*)    All other components within normal limits  CBC - Abnormal; Notable for the following:    RBC 3.69 (*)    Hemoglobin 11.1 (*)    HCT 33.5 (*)    All other components within normal limits  PROTIME-INR - Abnormal; Notable for the following:    Prothrombin Time 15.4 (*)    All other components within normal limits  BASIC METABOLIC PANEL - Abnormal; Notable for the following:    Sodium 134 (*)    Potassium 3.4 (*)    GFR calc non Af Amer 83 (*)    All other components within normal limits  CBC - Abnormal; Notable for the following:    RBC 3.68 (*)    Hemoglobin 10.9 (*)    HCT 33.4 (*)    All other components within normal limits  CBC - Abnormal; Notable for the following:    RBC 3.70 (*)    Hemoglobin 11.2 (*)    HCT 33.6 (*)    All other components within normal limits  BASIC METABOLIC PANEL - Abnormal; Notable for the following:    Glucose, Bld 110 (*)    GFR calc non Af Amer 82 (*)    All other components within normal limits  CBC - Abnormal; Notable for the following:    RBC 3.81 (*)    Hemoglobin 11.3 (*)    HCT 34.7 (*)    All other components within normal limits  URINE CULTURE  BODY FLUID CULTURE  TROPONIN I  MAGNESIUM  PH, BODY FLUID  PROTEIN, BODY FLUID  GLUCOSE, SEROUS FLUID  CHOLESTEROL, BODY FLUID  HEMATOCRIT, BODY FLUID  APTT  VITAMIN B12  RPR  MISCELLANEOUS TEST  TYPE AND SCREEN  ABO/RH  CYTOLOGY - NON PAP   SURGICAL PATHOLOGY   Imaging Review No results found.  EKG Interpretation    Date/Time:  Thursday May 10 2013 11:16:21 EST Ventricular Rate:  82 PR Interval:  157 QRS Duration: 99 QT Interval:  374 QTC Calculation: 437 R Axis:   -43 Text Interpretation:  Sinus rhythm Left axis deviation ST elevation, consider inferior injury ED PHYSICIAN INTERPRETATION AVAILABLE IN [facility] Confirmed by TEST, RECORD ([id number]), editor CLAYTON  CCT  CETT, ROBIN (2) on 05/12/2013 8:31:29 AM            MDM   1. Depression   2. Allergic rhinitis, cause unspecified   3. Chronic rheumatic arthritis   4. Esophageal reflux   5. Falls, sequela   6. Rib fracture, right, sequela   7. Hemothorax on right   8. Polyarthritis, inflammatory   9. Leukocytosis   10. Normocytic anemia   11. Hypokalemia   12. Hyponatremia   13. Falls, subsequent encounter   14. ALLERGIC RHINITIS   15. HYPERTENSION   16. Dizziness   17. Vertigo   18. Blind right eye   19. Glaucoma   20. Impaired glucose tolerance   21. Preventative health care   22. TRIGGER FINGER, RIGHT MIDDLE   23. OSTEOPENIA  24. GERD   25. SHOULDER IMPINGEMENT SYNDROME, LEFT   26. HYPERLIPIDEMIA    Dg Chest 1 View  05/11/2013   CLINICAL DATA:  Status post right-sided thoracentesis. Healing rib fractures.  EXAM: CHEST - 1 VIEW  COMPARISON:  Chest x-ray 05/10/2013.  FINDINGS: Compared to the prior chest x-ray, the previously noted right-sided pleural effusion has decreased in size, but remains moderate. There has been some concurrent re-expansion of the right middle and lower lobe, although there a continues to be extensive opacities throughout the right mid to lower lung, likely to represent areas of passive atelectasis (underlying airspace consolidation is not excluded). Left lung is clear. No pneumothorax. No evidence of pulmonary edema. Heart size is normal. Mediastinal contours are distorted by patient positioning.  IMPRESSION: 1.  Decreased size of right-sided pleural effusion following thoracentesis. No pneumothorax.   Electronically Signed   By: Trudie Reed M.D.   On: 05/11/2013 18:14   Dg Chest 1 View  05/10/2013   CLINICAL DATA:  Post right thoracentesis  EXAM: CHEST - 1 VIEW  COMPARISON:  05/10/2013 at 1108 hr  FINDINGS: Enlargement of cardiac silhouette.  Tortuous aorta.  Moderate right pleural effusion decreased since previous exam.  No pneumothorax.  Persistent right basilar atelectasis.  Diffuse osseous demineralization.  IMPRESSION: Decrease in right pleural effusion and right basilar atelectasis post pneumothorax though moderate right pleural effusion remains.  No pneumothorax post thoracentesis.   Electronically Signed   By: Ulyses Southward M.D.   On: 05/10/2013 17:08   Dg Chest 2 View  05/10/2013   CLINICAL DATA:  Coughing.  EXAM: CHEST  2 VIEW  COMPARISON:  05/04/2013.  03/07/2013.  FINDINGS: Mediastinum is normal. Progressive right pleural fluid collection is present occupying almost the entire right hemithorax. Given patient's history of prior right 10th rib fracture this could represent a developing hemothorax as noted on prior report. CT can be obtained for further evaluation if clinically indicated. Left lung is clear. No pneumothorax. Heart size is normal. Lower right ribs not imaged on today's exam.  IMPRESSION: 1. Progressive right pleural fluid collection. Right pleural fluid collection occupies almost the entire right hemithorax. Given patient's history of prior rib fracture the possibility of developing hemothorax cannot be excluded. Chest CT can be obtained for further evaluation as clinically indicated. 2. No evidence of pneumothorax. Lower right ribs not imaged on today's exam . These results will be called to the ordering clinician or representative by the Radiologist Assistant, and communication documented in the PACS Dashboard.   Electronically Signed   By: Maisie Fus  Register   On: 05/10/2013 11:29   Ct  Head Wo Contrast  05/10/2013   CLINICAL DATA:  Increase memory loss.  Mild confusion.  EXAM: CT HEAD WITHOUT CONTRAST  TECHNIQUE: Contiguous axial images were obtained from the base of the skull through the vertex without intravenous contrast.  COMPARISON:  None.  FINDINGS: No intracranial hemorrhage.  Small vessel disease type changes without CT evidence of large acute infarct.  Atrophy with ventricular prominence felt to be related to atrophy rather than hydrocephalus.  No intracranial mass lesion noted on this unenhanced exam.  Vascular calcifications.  Prior surgery involving the right globe.  IMPRESSION: No acute abnormality.  Please see above   Electronically Signed   By: Bridgett Larsson M.D.   On: 05/10/2013 19:16   Ct Chest Wo Contrast  05/10/2013   CLINICAL DATA:  Shortness of breath.  Coughing.  EXAM: CT CHEST WITHOUT CONTRAST  TECHNIQUE: Multidetector CT  imaging of the chest was performed following the standard protocol without IV contrast.  COMPARISON:  None.  FINDINGS: Progressive right pleural low attenuation fluid collection which now occupies almost the entire right hemithorax. Compressed lung centrally without visible infiltrate. Mild right-to-left mediastinal shift. Some residual aerated lung in right apex. Clear left lung.  Displaced right 9th and 10th rib fractures. Nondisplaced 11th and 12th rib fractures. Granulomatous calcified mediastinal lymph nodes. 3 vessel coronary artery calcification. No pericardial effusion. No left pleural effusion. No vertebral body compression fracture. Unremarkable appearing upper abdominal viscera. Negative chest wall.  IMPRESSION: Multiple subacute right 9-12 rib fractures without healing. Large low-attenuation right pleural effusion, not consistent with hemothorax. Mild right to left mediastinal shift. Significant compression of the underlying right lung. Thoracentesis recommended for further evaluation.   Electronically Signed   By: Davonna Belling M.D.   On:  05/10/2013 14:42    Vitals stable in ED With recent fall concern for hemothorax. Discussed with hospitalist, pt went for thorocentesis from ED.  Admission arranged.  Updated family.  Weakness, Falls, Right rib fx, Hemothorax right, Right pleural effusion  Enid Skeens, MD 05/21/13 [phone]

## 2013-05-23 ENCOUNTER — Other Ambulatory Visit (INDEPENDENT_AMBULATORY_CARE_PROVIDER_SITE_OTHER): Payer: Medicare Other

## 2013-05-23 ENCOUNTER — Ambulatory Visit (INDEPENDENT_AMBULATORY_CARE_PROVIDER_SITE_OTHER)
Admission: RE | Admit: 2013-05-23 | Discharge: 2013-05-23 | Disposition: A | Discharge status: Home / Self Care | Payer: Medicare Other | Source: Ambulatory Visit | Attending: Internal Medicine | Admitting: Internal Medicine

## 2013-05-23 ENCOUNTER — Ambulatory Visit (INDEPENDENT_AMBULATORY_CARE_PROVIDER_SITE_OTHER): Payer: Medicare Other | Admitting: Internal Medicine

## 2013-05-23 ENCOUNTER — Encounter: Payer: Self-pay | Admitting: Internal Medicine

## 2013-05-23 VITALS — BP 124/76 | HR 100 | Temp 98.0°F

## 2013-05-23 DIAGNOSIS — J90 Pleural effusion, not elsewhere classified: Secondary | ICD-10-CM

## 2013-05-23 DIAGNOSIS — J942 Hemothorax: Secondary | ICD-10-CM

## 2013-05-23 DIAGNOSIS — M069 Rheumatoid arthritis, unspecified: Secondary | ICD-10-CM

## 2013-05-23 LAB — CBC WITH DIFFERENTIAL/PLATELET
Basophils Absolute: 0 10*3/uL (ref 0.0–0.1)
Basophils Relative: 0.2 % (ref 0.0–3.0)
Eosinophils Absolute: 0.2 10*3/uL (ref 0.0–0.7)
Hemoglobin: 11.1 g/dL — ABNORMAL LOW (ref 12.0–15.0)
Lymphocytes Relative: 6.8 % — ABNORMAL LOW (ref 12.0–46.0)
Lymphs Abs: 0.8 10*3/uL (ref 0.7–4.0)
Monocytes Relative: 4.1 % (ref 3.0–12.0)
Neutro Abs: 10.4 10*3/uL — ABNORMAL HIGH (ref 1.4–7.7)
Platelets: 409 10*3/uL — ABNORMAL HIGH (ref 150.0–400.0)
RBC: 3.87 Mil/uL (ref 3.87–5.11)
RDW: 16.3 % — ABNORMAL HIGH (ref 11.5–14.6)

## 2013-05-23 MED ORDER — PREDNISONE (PAK) 10 MG PO TABS: ORAL_TABLET | ORAL | Status: DC

## 2013-05-23 NOTE — Progress Notes (Signed)
Subjective:    Patient ID: Jordan Henderson, female    DOB: [birth year]  MRN: [id number]  HPI  52 yowf   quit smoking 1990 and fell in early Sept 2014 while at home ultimately dx'd as rib fx with effusion > admit    Admit date: 05/10/2013  Discharge date: 05/14/2013  Recommendations for Outpatient Follow-up:  Right hemothorax/S/P thoracentesis x2; followup with Dr. Sandrea Hughs on December 1 at [address] [facility]. Her thoracentesis culture are negative  Discharge Diagnoses:  Principal Problem:  Hemothorax on right  Active Problems:  HYPERTENSION  ALLERGIC RHINITIS  Falls  Rib fracture  Chronic rheumatic arthritis  Depression  Leukocytosis  Normocytic anemia  Hypokalemia  Hyponatremia  Discharge Condition: stable, improve  Diet recommendation: regular  Wt Readings from Last 3 Encounters:   05/10/13  58 kg (127 lb 13.9 oz)   05/04/13  59.875 kg (132 lb)   03/09/13  62.823 kg (138 lb 8 oz)   History of present illness:  The patient is a 79 y.o WF PMHx diet controlled HTN and HLD, GERD, rheumatoid arthritis on immunosuppression who presents with progressive SOB, cough, weakness, and memory problems. The patient was last at their baseline health several months ago. About two months ago, she had a fall and broke several ribs on the right chest. Her diagnostic CXR demonstrated no pleural effusion. About one month ago, she had another fall and injured her right shoulder. Since that time, she has had progressive weakness. As of 4 months ago, she was living independently, however, over the last couple of weaks, she has needed assistance to get out of bed and get to the bathroom. She has been coughing frequently and over the last three weeks, she has been short of breath with minimal exertion. Additionally, her family has noticed that she has had increases short term memory problems. For example, sometimes, she thinks she is at a hotel even though she is in a house she has known for 30  years. A week ago, she was seen by her PCP, who repeated her CXR which demonstrated a large right pleural effusion (05/04/2013) and she was referred to pulmonology. She actually had an appointment scheduled for today, but due to her progressive SOB, her family brought her to the ER. Of note, she smoked "but did not inhale" many years ago and there is no family history of lung cancer. She was on aspirin, but stopped more than 3 months ago, even before her first fall, and has not been on any other blood thinners.  In the ER, her VS were stable. Her labs were notable for WBC 10.9, hgb 11.6, plt 294, Na 134, potassium 3, creatinine 0.61. UA with SG of 1.029, ketones, 11-20 WBC. Her CXR demonstrated a large right pleural effusion and follow up CT chest demonstrated a large low-attenuation right pleural effusion with mild right to left mediastinal shift and significant compression of the underlying right lung. She is being admitted for management of progressive pleural effusion with SOB and progressive weakness.  Prior to admission I spoke with Pulmonology and with Radiology. She underwent diagnostic and therapeutic thoracentesis with removal of 2.5L of bright bloody fluid. I have changed her admission from telemetry to stepdown in the event that she is having active bleeding.  Hospital Course:  Right pleural effusion. [name] underwent thoracentesis by radiology at the time of admission with removal of 2.5L of bright red fluid. Tests demonstrated HCT < 2, cell count WBC 73, protein 3.9,  glucose 87, pf LDH 244. Light's criteria were positive, LDH ratio of 0.9, protein ratio 0.56, but not profoundly so. Her glucose is normal and pH was 8. She had few inflammatory cells on smear and culture remained negative. Most likely she had a small hemothorax post fall and developed a reactive effusion which may have been exacerbated by her underlying rheumatologic disorder. I spoke with Dr. Sung Amabile from Pulmonology who  recommended repeating her thoracentesis to drain residual fluid. Radiology drained an additional 1L from her right chest with a small amount of effusion remaining post-procedure and no pneumothorax. Her SOB drastically improved after her second thoracentesis and although she remained weak, she was able to stand and transfer without dyspnea whereas previously she was SOB at rest.  Hemothorax right side; result after 2.5 L exudative removed by thoracentesis  Frequent fall; patient will be discharged to SNF for strengthening and range of motion training  Normocytic anemia: Hemoglobin remained stable 11.3mg /dl.  Progressive weakness; patient was up and moving around today to go to and from bathroom, however still will require rehabilitation at SNF.  Mild dementia, currently A./O. x4 CT head demonstrates atrophy. TSH wnl, vit B12 wnl, RPR NR.  Depression. Will cont. Celexa 10 mg daily .  Leukocytosis, resolved  Mild hyponatremia and hypokalemia resolved  Moderate protein-calorie malnutrition. Continue megace.  Rheumatoid arthritis. Continue hold methotrexate and allow patient's PCP to restart    05/24/2013  ov/Rihaan Barrack re:  Chief Complaint  Patient presents with  . Pulmonary Consult    Referred per Dr. Oliver Barre. Pt recently d/c'ed from [facility]. She had thoracentesis during her admission. She states that her breathing has improved since she was d/c'ed.   RA symptoms are generally much worse, poor appetite, but only hurts with deep breath on R and no sob  On RA though mostly w/c bound at this point.   No obvious day to day or daytime variabilty or assoc chronic cough or   chest tightness, subjective wheeze overt sinus or hb symptoms. No unusual exp hx or h/o childhood pna/ asthma or knowledge of premature birth.  Sleeping ok without nocturnal  or early am exacerbation  of respiratory  c/o's or need for noct saba. Also denies any obvious fluctuation of symptoms with weather or environmental changes or other  aggravating or alleviating factors except as outlined above   Current Medications, Allergies, Complete Past Medical History, Past Surgical History, Family History, and Social History were reviewed in [employer] record.          Review of Systems  Constitutional: Positive for appetite change. Negative for fever, chills and unexpected weight change.  HENT: Negative for congestion, dental problem, ear pain, nosebleeds, postnasal drip, rhinorrhea, sinus pressure, sneezing, sore throat, trouble swallowing and voice change.   Eyes: Negative for visual disturbance.  Respiratory: Negative for cough, choking and shortness of breath.   Cardiovascular: Negative for chest pain and leg swelling.  Gastrointestinal: Negative for vomiting, abdominal pain and diarrhea.  Genitourinary: Negative for difficulty urinating.  Musculoskeletal: Negative for arthralgias.  Skin: Negative for rash.  Neurological: Negative for tremors, syncope and headaches.  Hematological: Does not bruise/bleed easily.       Objective:   Physical Exam   Pale very frail appearing Elderly wf in wc with very weathered facies   HEENT: nl dentition, turbinates, and orophanx. Nl external ear canals without cough reflex   NECK :  without JVD/Nodes/TM/ nl carotid upstrokes bilaterally   LUNGS: no acc muscle use,  clear to A and P bilaterally without cough on insp or exp maneuvers   CV:  RRR  no s3 or murmur or increase in P2, no edema   ABD:  soft and nontender with nl excursion in the supine position. No bruits or organomegaly, bowel sounds nl  MS:  warm without deformities, calf tenderness, cyanosis or clubbing - RA changes both hands, mod  SKIN: warm and dry without lesions    NEURO:  alert, approp, no deficits   cxr 05/23/13  Decreased small right-sided pleural effusion/hemothorax. Improved adjacent aeration, likely due to resolving atelectasis. Right-sided rib fractures. Cardiomegaly  without congestive failure.       Assessment & Plan:

## 2013-05-23 NOTE — Patient Instructions (Signed)
predisone 20 mg daily x 7 days and 10 mg daily x 7 days   Please remember to go to the lab and x-ray department downstairs for your tests - we will call you with the results when they are available.     Please schedule a follow up office visit in 2 weeks, sooner if needed with cxr

## 2013-05-24 ENCOUNTER — Telehealth: Payer: Self-pay | Admitting: Internal Medicine

## 2013-05-24 NOTE — Progress Notes (Signed)
Quick Note:  LMTCB ______ 

## 2013-05-24 NOTE — Assessment & Plan Note (Addendum)
R Thoracentesis x 2.5 liters 05/11/13 exudative bloody nl glucose R  Tcentesis x 1 liters 05/14/13  - ESR 96 05/23/13 but more likely due to RA flare than PCIS - hgb unchanged 06/02/13 @ 11.1  Classic traumatic effusion with possible component of PCIS (dressler's like) in pt with RA but no evidence of RA effusion by glucose levels  No further w/u or rx needed

## 2013-05-24 NOTE — Assessment & Plan Note (Addendum)
ESR 96 05/23/13 > rec pred 20 x 7 d, 10 x 7days and then regroup   Mild flare on mtx  > defer longterm rx to Dr Kellie Simmering      No evidence of RA lung or pleural dz at this point .

## 2013-05-24 NOTE — Telephone Encounter (Signed)
Pt's daughter is aware of CXR and labs.

## 2013-06-01 ENCOUNTER — Ambulatory Visit: Payer: Medicare Other | Admitting: Internal Medicine

## 2013-06-05 ENCOUNTER — Encounter: Payer: Self-pay | Admitting: Internal Medicine

## 2013-06-05 ENCOUNTER — Ambulatory Visit (INDEPENDENT_AMBULATORY_CARE_PROVIDER_SITE_OTHER): Payer: Medicare Other | Admitting: Internal Medicine

## 2013-06-05 ENCOUNTER — Ambulatory Visit (INDEPENDENT_AMBULATORY_CARE_PROVIDER_SITE_OTHER)
Admission: RE | Admit: 2013-06-05 | Discharge: 2013-06-05 | Disposition: A | Discharge status: Home / Self Care | Payer: Medicare Other | Source: Ambulatory Visit | Attending: Internal Medicine | Admitting: Internal Medicine

## 2013-06-05 VITALS — BP 114/76 | HR 70 | Temp 97.7°F

## 2013-06-05 DIAGNOSIS — J90 Pleural effusion, not elsewhere classified: Secondary | ICD-10-CM

## 2013-06-05 DIAGNOSIS — M069 Rheumatoid arthritis, unspecified: Secondary | ICD-10-CM

## 2013-06-05 DIAGNOSIS — J942 Hemothorax: Secondary | ICD-10-CM

## 2013-06-05 NOTE — Progress Notes (Signed)
Subjective:    Patient ID: Jordan Henderson, female    DOB: [birth year]  MRN: [id number]  HPI  29 yowf   quit smoking 1990 and fell in early Sept 2014 while at home ultimately dx'd as rib fx with effusion > admit    Admit date: 05/10/2013  Discharge date: 05/14/2013  Recommendations for Outpatient Follow-up:  Right hemothorax/S/P thoracentesis x2; followup with Dr. Sandrea Hughs on December 1 at [address] [facility]. Her thoracentesis culture are negative  Discharge Diagnoses:  Principal Problem:  Hemothorax on right  Active Problems:  HYPERTENSION  ALLERGIC RHINITIS  Falls  Rib fracture  Chronic rheumatic arthritis  Depression  Leukocytosis  Normocytic anemia  Hypokalemia  Hyponatremia  Discharge Condition: stable, improve  Diet recommendation: regular  Wt Readings from Last 3 Encounters:   05/10/13  58 kg (127 lb 13.9 oz)   05/04/13  59.875 kg (132 lb)   03/09/13  62.823 kg (138 lb 8 oz)   History of present illness:  The patient is a 79 y.o WF PMHx diet controlled HTN and HLD, GERD, rheumatoid arthritis on immunosuppression who presents with progressive SOB, cough, weakness, and memory problems. The patient was last at their baseline health several months ago. About two months ago, she had a fall and broke several ribs on the right chest. Her diagnostic CXR demonstrated no pleural effusion. About one month ago, she had another fall and injured her right shoulder. Since that time, she has had progressive weakness. As of 4 months ago, she was living independently, however, over the last couple of weaks, she has needed assistance to get out of bed and get to the bathroom. She has been coughing frequently and over the last three weeks, she has been short of breath with minimal exertion. Additionally, her family has noticed that she has had increases short term memory problems. For example, sometimes, she thinks she is at a hotel even though she is in a house she has known for 30  years. A week ago, she was seen by her PCP, who repeated her CXR which demonstrated a large right pleural effusion (05/04/2013) and she was referred to pulmonology. She actually had an appointment scheduled for today, but due to her progressive SOB, her family brought her to the ER. Of note, she smoked "but did not inhale" many years ago and there is no family history of lung cancer. She was on aspirin, but stopped more than 3 months ago, even before her first fall, and has not been on any other blood thinners.  In the ER, her VS were stable. Her labs were notable for WBC 10.9, hgb 11.6, plt 294, Na 134, potassium 3, creatinine 0.61. UA with SG of 1.029, ketones, 11-20 WBC. Her CXR demonstrated a large right pleural effusion and follow up CT chest demonstrated a large low-attenuation right pleural effusion with mild right to left mediastinal shift and significant compression of the underlying right lung. She is being admitted for management of progressive pleural effusion with SOB and progressive weakness.  Prior to admission I spoke with Pulmonology and with Radiology. She underwent diagnostic and therapeutic thoracentesis with removal of 2.5L of bright bloody fluid. I have changed her admission from telemetry to stepdown in the event that she is having active bleeding.  Hospital Course:  Right pleural effusion. Ms. Menard underwent thoracentesis by radiology at the time of admission with removal of 2.5L of bright red fluid. Tests demonstrated HCT < 2, cell count WBC 73, protein 3.9,  glucose 87, pf LDH 244. Light's criteria were positive, LDH ratio of 0.9, protein ratio 0.56, but not profoundly so. Her glucose is normal and pH was 8. She had few inflammatory cells on smear and culture remained negative. Most likely she had a small hemothorax post fall and developed a reactive effusion which may have been exacerbated by her underlying rheumatologic disorder. I spoke with Dr. Sung Amabile from Pulmonology who  recommended repeating her thoracentesis to drain residual fluid. Radiology drained an additional 1L from her right chest with a small amount of effusion remaining post-procedure and no pneumothorax. Her SOB drastically improved after her second thoracentesis and although she remained weak, she was able to stand and transfer without dyspnea whereas previously she was SOB at rest.  Hemothorax right side; result after 2.5 L exudative removed by thoracentesis  Frequent fall; patient will be discharged to SNF for strengthening and range of motion training  Normocytic anemia: Hemoglobin remained stable 11.3mg /dl.  Progressive weakness; patient was up and moving around today to go to and from bathroom, however still will require rehabilitation at SNF.  Mild dementia, currently A./O. x4 CT head demonstrates atrophy. TSH wnl, vit B12 wnl, RPR NR.  Depression. Will cont. Celexa 10 mg daily .  Leukocytosis, resolved  Mild hyponatremia and hypokalemia resolved  Moderate protein-calorie malnutrition. Continue megace.  Rheumatoid arthritis. Continue hold methotrexate and allow patient's PCP to restart    05/24/2013  ov/Jordan Henderson re:  Chief Complaint  Patient presents with  . Pulmonary Consult    Referred per Dr. Oliver Barre. Pt recently d/c'ed from [facility]. She had thoracentesis during her admission. She states that her breathing has improved since she was d/c'ed.   RA symptoms are generally much worse, poor appetite, but only hurts with deep breath on R and no sob  On RA though mostly w/c bound at this point.  rec predisone 20 mg daily x 7 days and 10 mg daily x 7 days    06/05/2013 f/u ov/Jordan Henderson re: hemothorax/? RA related effusion/ back on mtx  Chief Complaint  Patient presents with  . Follow-up with cxr    Breathing is doing well. No new co's today.   no more cp, arthritis now is better, no sob  No obvious day to day or daytime variabilty or assoc chronic cough or cp or chest tightness, subjective wheeze  overt sinus or hb symptoms. No unusual exp hx or h/o childhood pna/ asthma or knowledge of premature birth.  Sleeping ok without nocturnal  or early am exacerbation  of respiratory  c/o's or need for noct saba. Also denies any obvious fluctuation of symptoms with weather or environmental changes or other aggravating or alleviating factors except as outlined above   Current Medications, Allergies, Complete Past Medical History, Past Surgical History, Family History, and Social History were reviewed in [employer] record.  ROS  The following are not active complaints unless bolded sore throat, dysphagia, dental problems, itching, sneezing,  nasal congestion or excess/ purulent secretions, ear ache,   fever, chills, sweats, unintended wt loss, pleuritic or exertional cp, hemoptysis,  orthopnea pnd or leg swelling, presyncope, palpitations, heartburn, abdominal pain, anorexia, nausea, vomiting, diarrhea  or change in bowel or urinary habits, change in stools or urine, dysuria,hematuria,  rash, arthralgias, visual complaints, headache, numbness weakness or ataxia or problems with walking or coordination,  change in mood/affect or memory.  Objective:   Physical Exam    frail appearing Elderly wf in [place] with very weathered facies   Wt Readings from Last 3 Encounters:  05/10/13 127 lb 13.9 oz (58 kg)  05/04/13 132 lb (59.875 kg)  03/09/13 138 lb 8 oz (62.823 kg)      HEENT: nl dentition, turbinates, and orophanx. Nl external ear canals without cough reflex   NECK :  without JVD/Nodes/TM/ nl carotid upstrokes bilaterally   LUNGS: no acc muscle use, clear to A and P bilaterally without cough on insp or exp maneuvers   CV:  RRR  no s3 or murmur or increase in P2, no edema   ABD:  soft and nontender with nl excursion in the supine position. No bruits or organomegaly, bowel sounds nl  MS:  warm without deformities, calf tenderness, cyanosis or  clubbing - RA changes both hands, mod  SKIN: warm and dry without lesions    NEURO:  alert, approp, no deficits   CXR  06/05/2013 :  There has been improvement from the prior study. No significant residual right pleural fluid is seen. Mild pleural thickening noted at the right lateral lung base and there are mild areas of right lower lung subsegmental atelectasis and/or scarring. No pneumothorax.        Assessment & Plan:

## 2013-06-05 NOTE — Patient Instructions (Signed)
ok to stop prednisone from my perspective - pulmonary follow up is as needed

## 2013-06-06 ENCOUNTER — Ambulatory Visit: Payer: Medicare Other | Admitting: Internal Medicine

## 2013-06-06 NOTE — Assessment & Plan Note (Signed)
ESR 96 05/23/13 > rec pred 20 x 7 d, 10 x 7days > off 12/16 14   Back on mtx, so far so good, further pred for RA per primary team if needed but not need from pulm perspective at this point

## 2013-06-06 NOTE — Assessment & Plan Note (Addendum)
S/p fall in Sept 2014 -R Thoracentesis x 2.5 liters 05/11/13 exudative bloody with nl glucose -R  Tcentesis x 1 liters 05/14/13  - ESR 96 05/23/13 but more likely due to RA flare than PCIS related >rx 14 d prednisone  resolved nicely  Has resolved on prednisone and the only remaining issue is whether will recur off pred and on mtx for RA   Pulmonary f/u is prn

## 2013-06-29 ENCOUNTER — Encounter: Payer: Medicare Other | Admitting: Internal Medicine

## 2014-11-01 IMAGING — CT CT HEAD W/O CM
1 series · 16 of 30 positions shown, 20 images · non-contrast
Comparison: None.

CLINICAL DATA: Increase memory loss.  Mild confusion.

EXAM:
CT HEAD WITHOUT CONTRAST
TECHNIQUE: Contiguous axial images were obtained from the base of the skull
through the vertex without intravenous contrast.

[Series 2: headseq 4.8 h45s · axial · 0.43mm/px · z∈[-608,-479]mm · 16 of 30 slices shown, 20 images]
[im 2/30  brain]
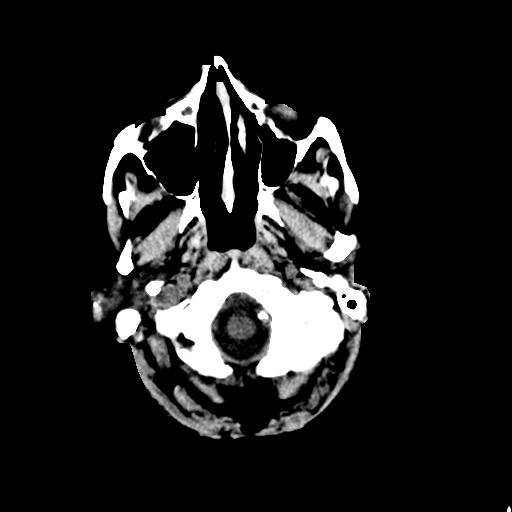
[im 2/30  bone]
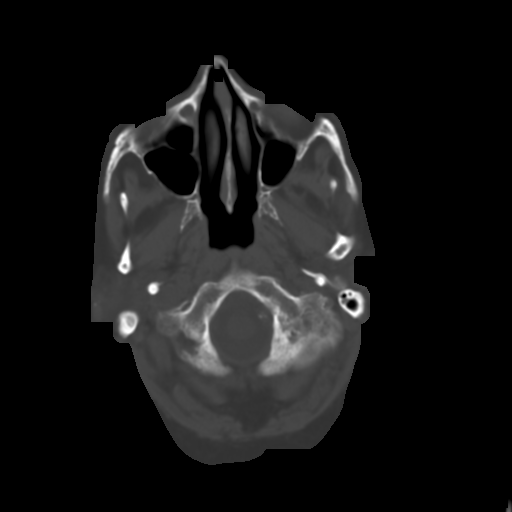
[im 4/30  brain]
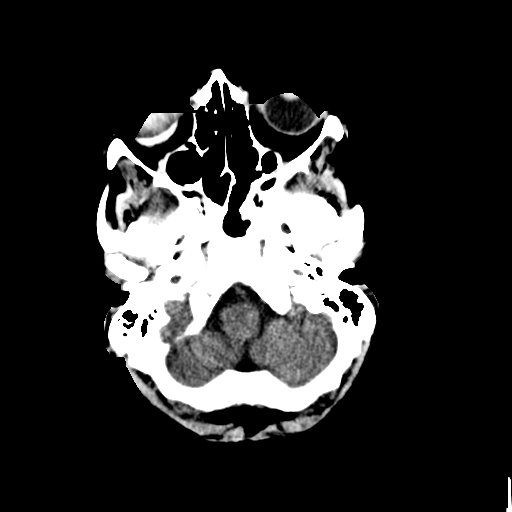
[im 6/30  brain]
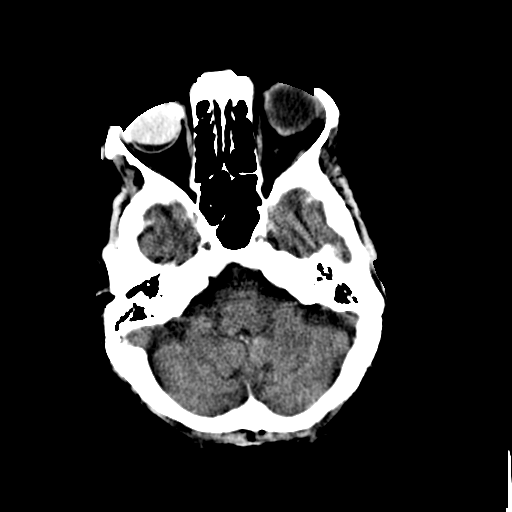
[im 8/30  brain]
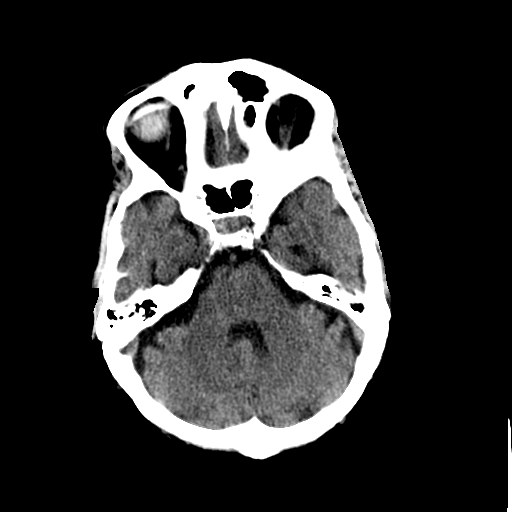
[im 9/30  brain]
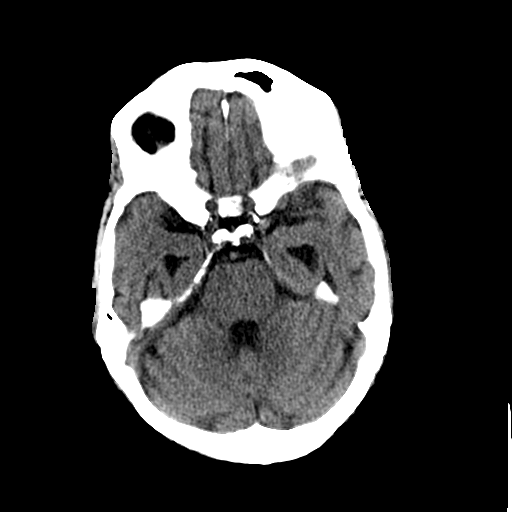
[im 9/30  bone]
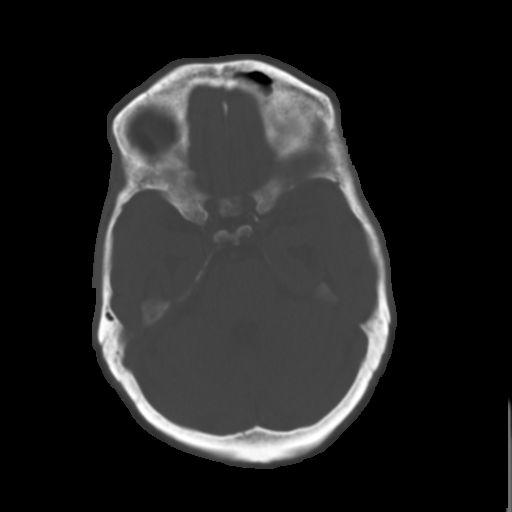
[im 11/30  brain]
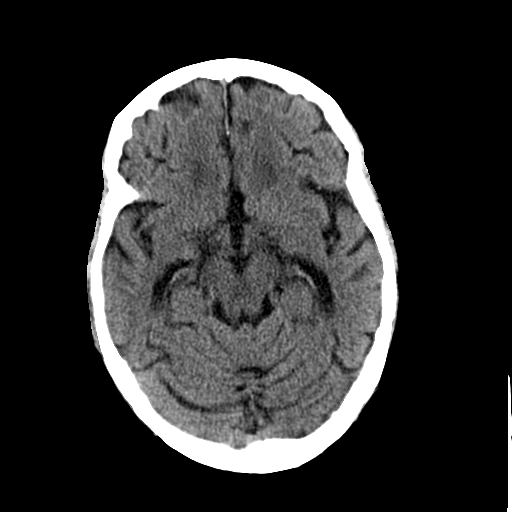
[im 13/30  brain]
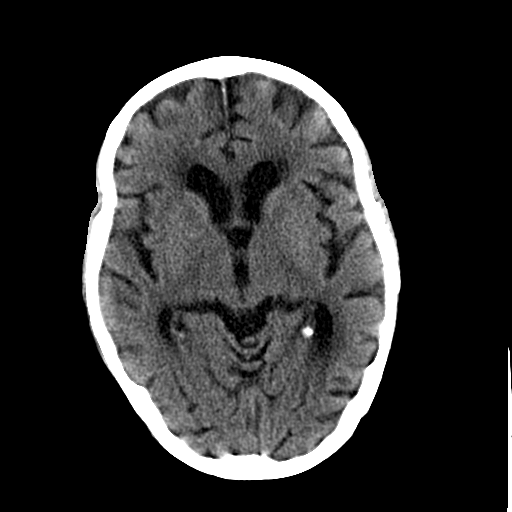
[im 15/30  brain]
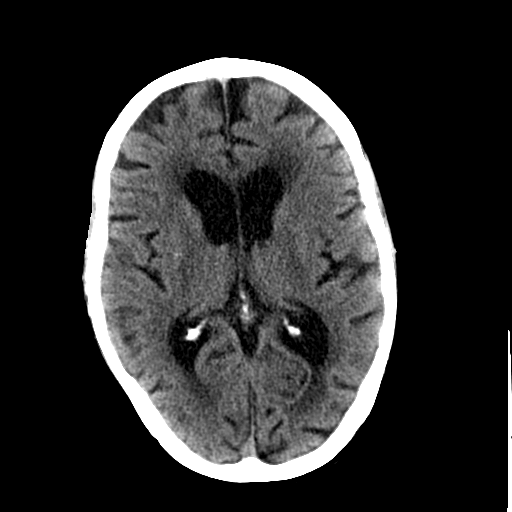
[im 16/30  brain]
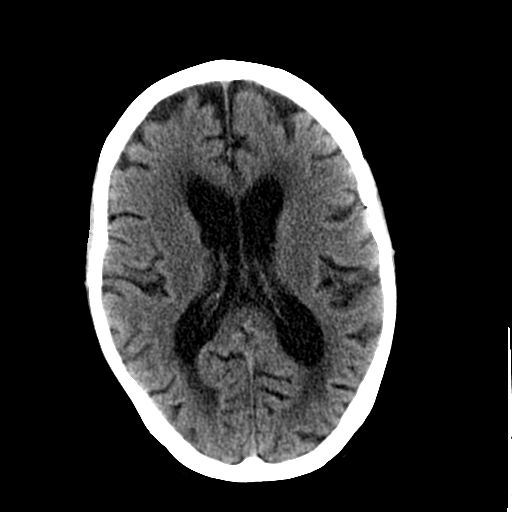
[im 16/30  bone]
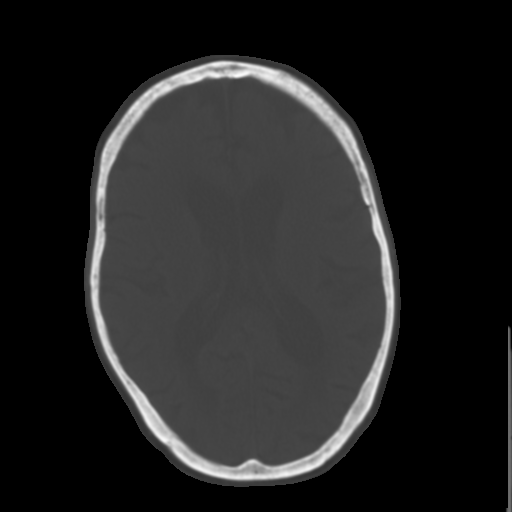
[im 18/30  brain]
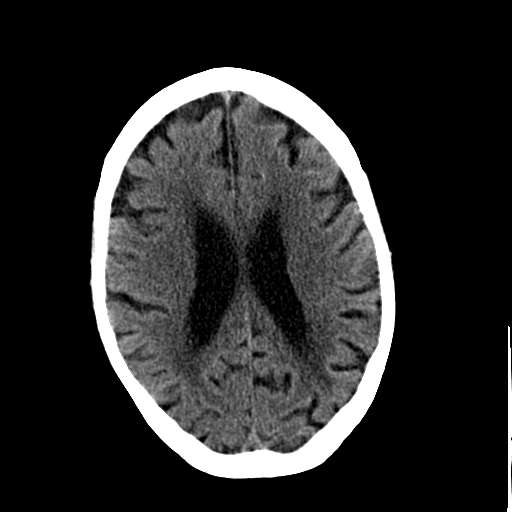
[im 20/30  brain]
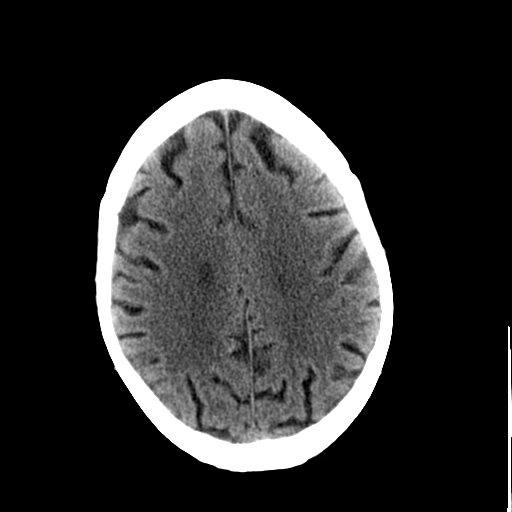
[im 22/30  brain]
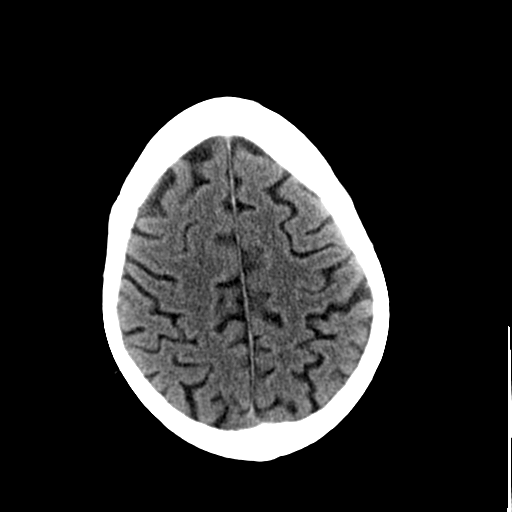
[im 23/30  brain]
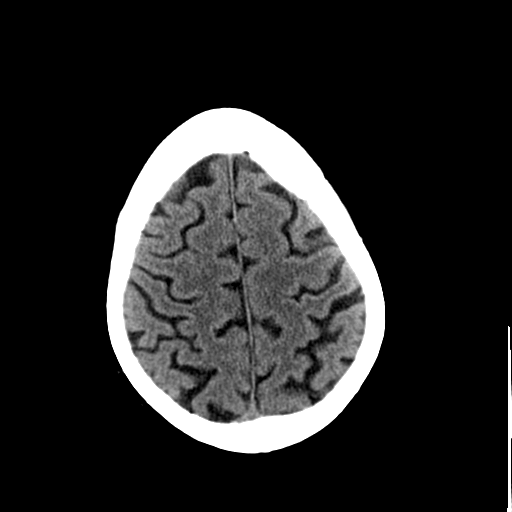
[im 23/30  bone]
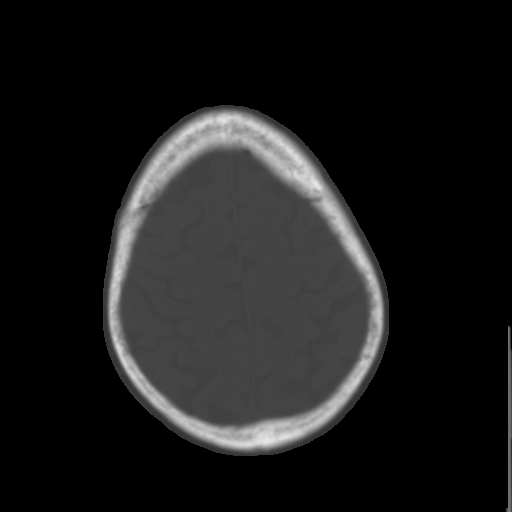
[im 25/30  brain]
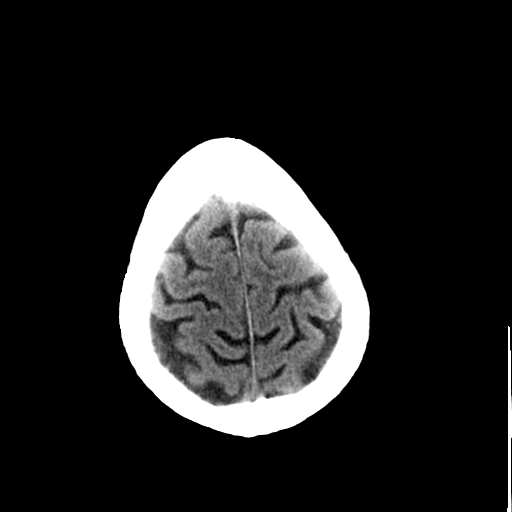
[im 27/30  brain]
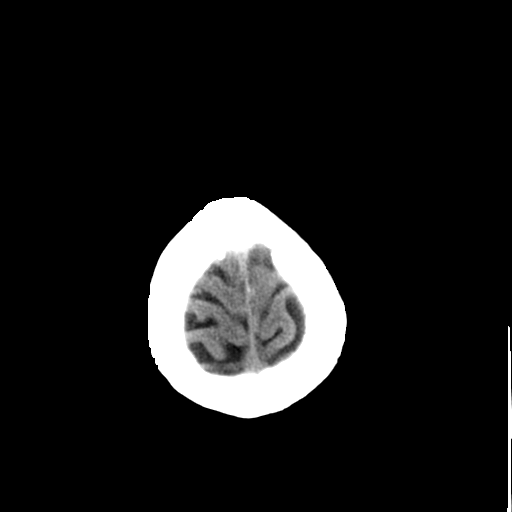
[im 29/30  brain]
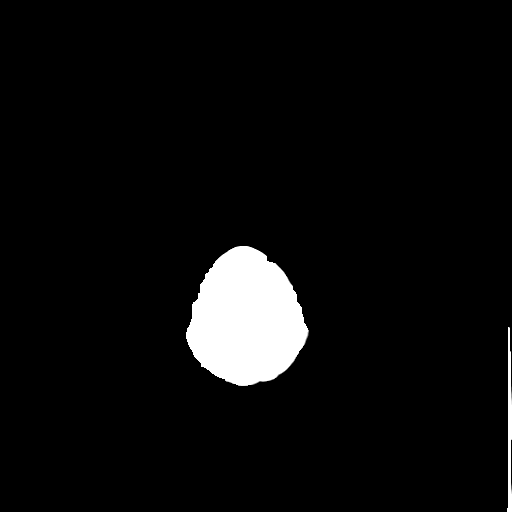

[16 of 30 positions shown; findings below may reference images not displayed]

FINDINGS: No intracranial hemorrhage.

Small vessel disease type changes without CT evidence of large acute
infarct.

Atrophy with ventricular prominence felt to be related to atrophy
rather than hydrocephalus.

No intracranial mass lesion noted on this unenhanced exam.

Vascular calcifications.

Prior surgery involving the right globe.
IMPRESSION: No acute abnormality.  Please see above

## 2014-11-01 IMAGING — CT CT CHEST W/O CM
1 of 2 series · 14 of 30 positions shown, 18 images · non-contrast
Comparison: None.

CLINICAL DATA: Shortness of breath.  Coughing.

EXAM:
CT CHEST WITHOUT CONTRAST
TECHNIQUE: Multidetector CT imaging of the chest was performed following the
standard protocol without IV contrast.

[Series 2: chest w/o st · axial · non-contrast · 0.72mm/px · z∈[+1294,+1550]mm · 14 of 61 slices shown, 18 images]
[im 5/61  mediastinal]
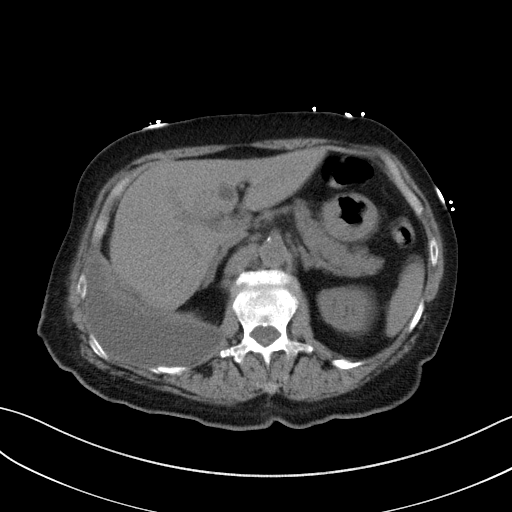
[im 5/61  lung]
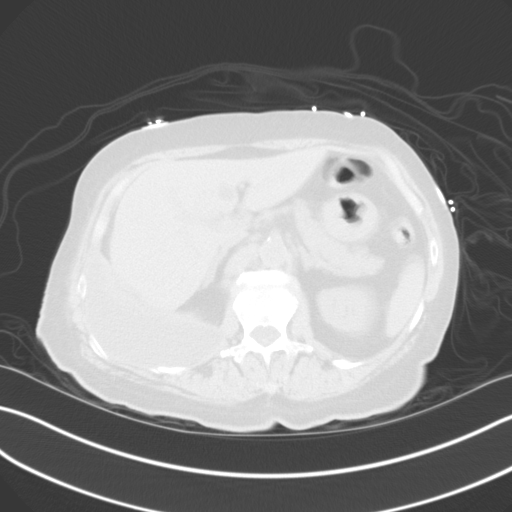
[im 9/61  lung]
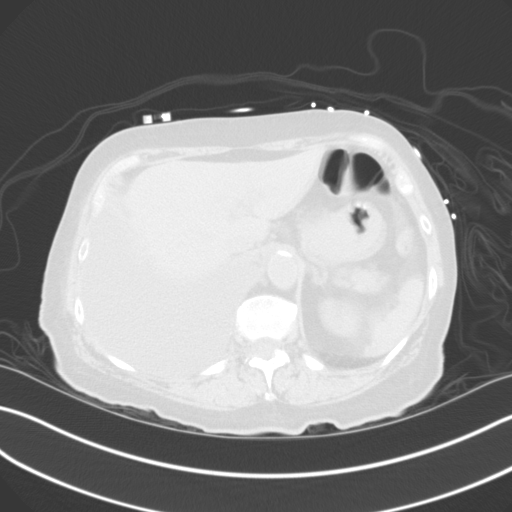
[im 13/61  lung]
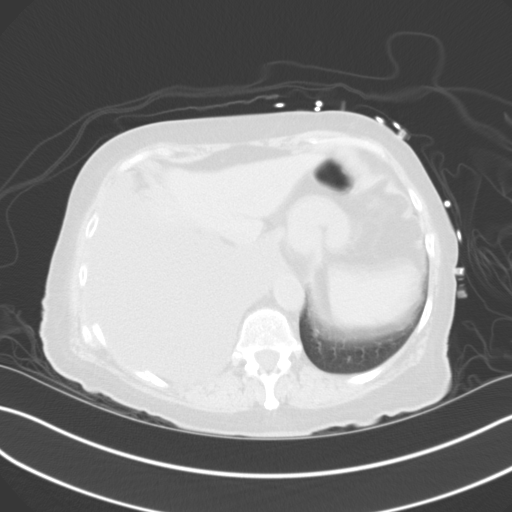
[im 18/61  lung]
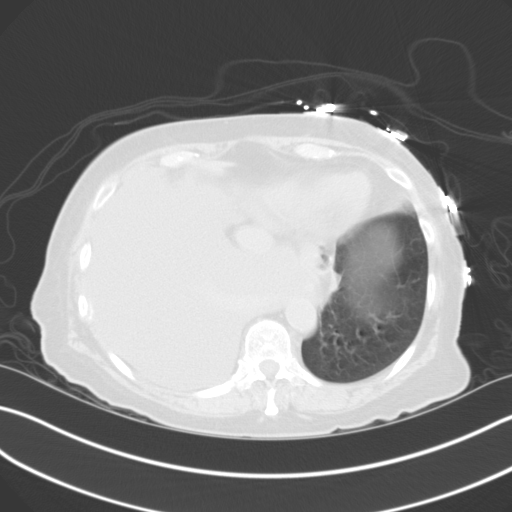
[im 22/61  mediastinal]
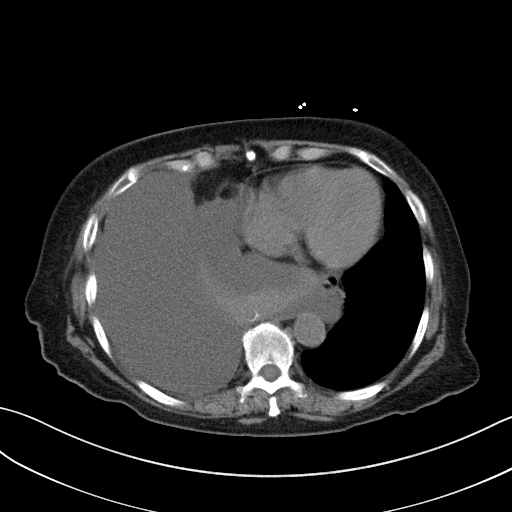
[im 22/61  lung]
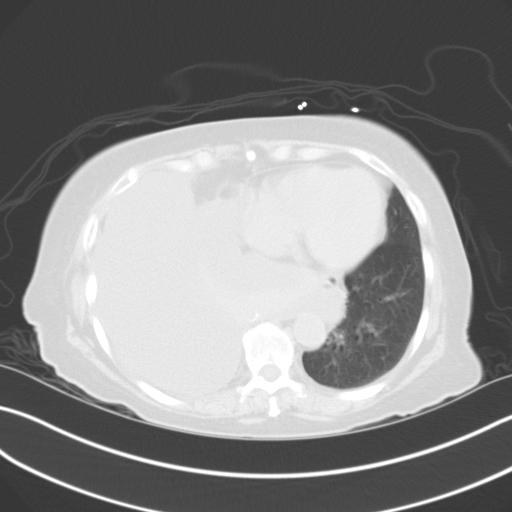
[im 26/61  lung]
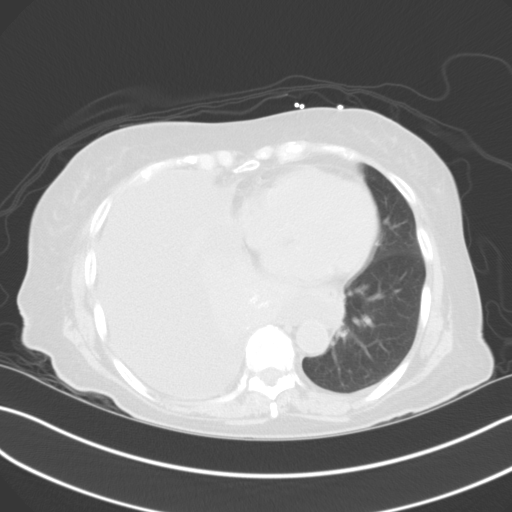
[im 29/61  lung]
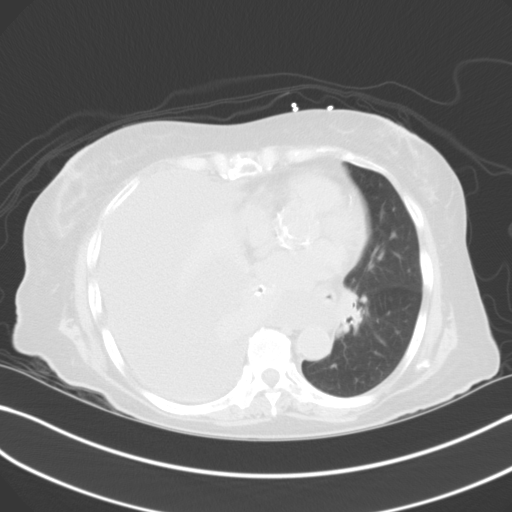
[im 31/61  lung]
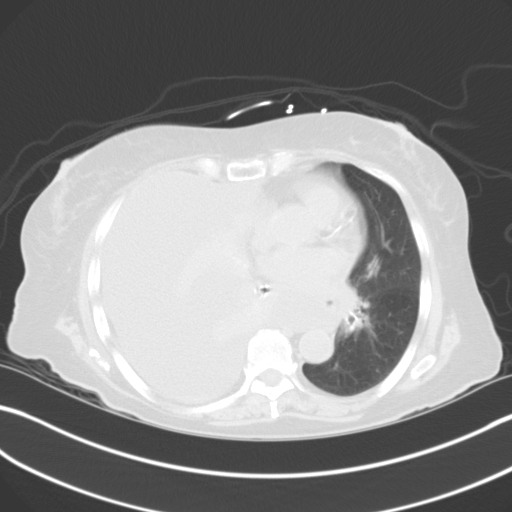
[im 35/61  mediastinal]
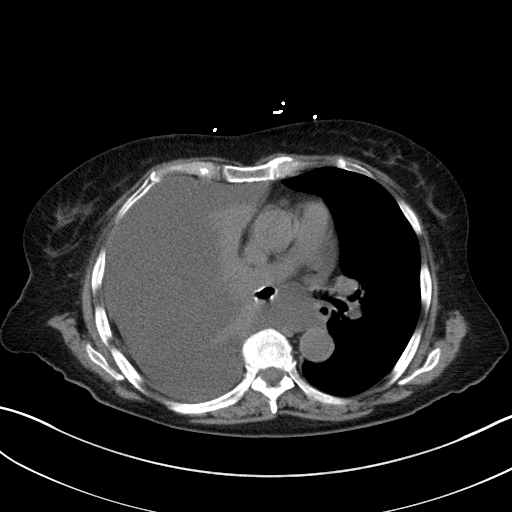
[im 35/61  lung]
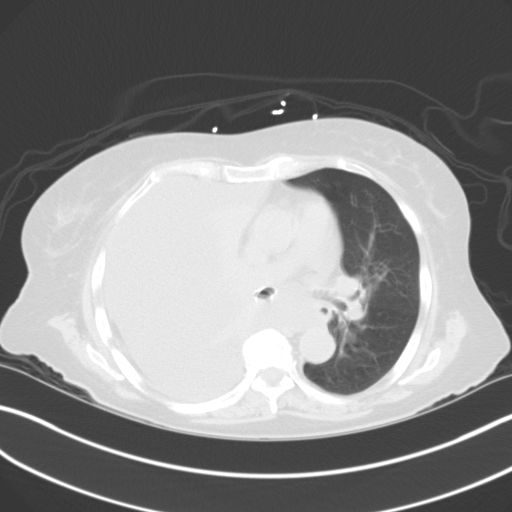
[im 39/61  lung]
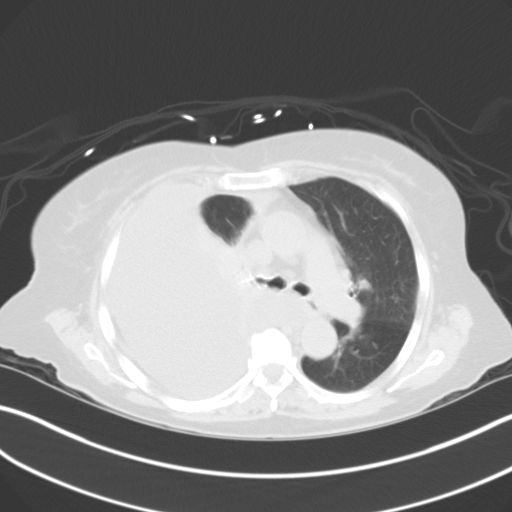
[im 43/61  lung]
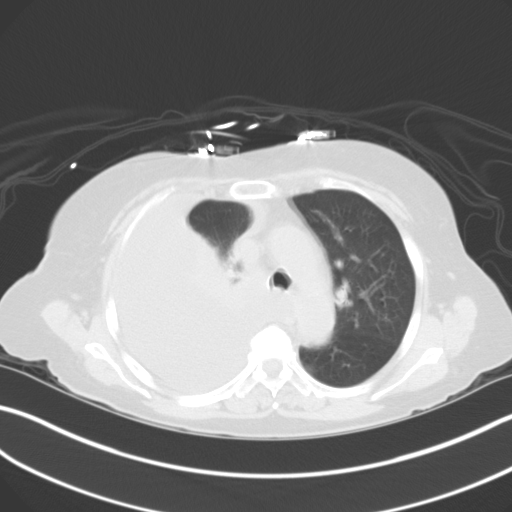
[im 48/61  lung]
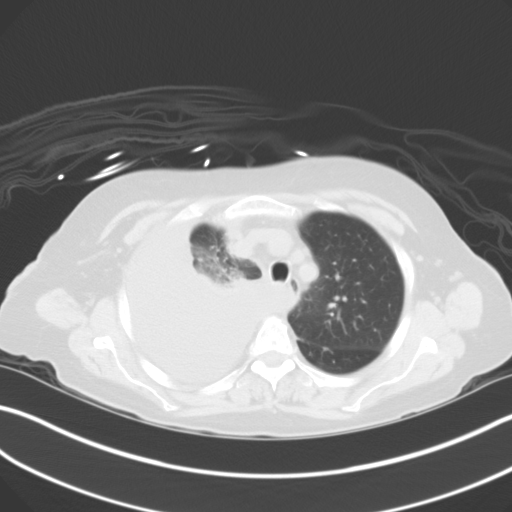
[im 52/61  mediastinal]
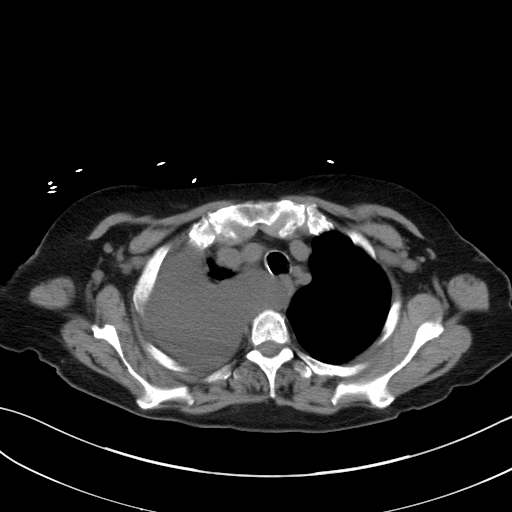
[im 52/61  lung]
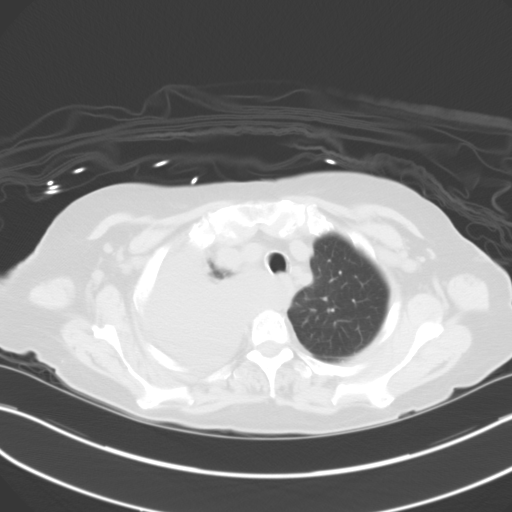
[im 56/61  lung]
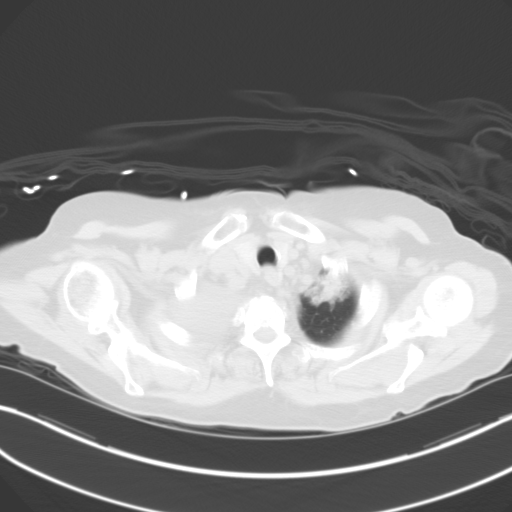

[14 of 30 positions shown; findings below may reference images not displayed]

FINDINGS: Progressive right pleural low attenuation fluid collection which now
occupies almost the entire right hemithorax. Compressed lung
centrally without visible infiltrate. Mild right-to-left mediastinal
shift. Some residual aerated lung in right apex. Clear left lung.

Displaced right 9th and 10th rib fractures. Nondisplaced 11th and
12th rib fractures. Granulomatous calcified mediastinal lymph nodes.
3 vessel coronary artery calcification. No pericardial effusion. No
left pleural effusion. No vertebral body compression fracture.
Unremarkable appearing upper abdominal viscera. Negative chest wall.
IMPRESSION: Multiple subacute right 9-12 rib fractures without healing. Large
low-attenuation right pleural effusion, not consistent with
hemothorax. Mild right to left mediastinal shift. Significant
compression of the underlying right lung. Thoracentesis recommended
for further evaluation.

## 2014-11-02 IMAGING — US US THORACENTESIS ASP PLEURAL SPACE W/IMG GUIDE
1 series · 4 of 4 positions shown · non-contrast
Comparison: PREVIOUS THORACENTESIS ON 05/10/2013.

CLINICAL DATA: Patient with history of fall at home with right rib
fractures, right pleural effusion. Request is made for therapeutic
right thoracentesis

EXAM:
ULTRASOUND GUIDED THERAPEUTIC RIGHT THORACENTESIS

[Series 1: us thoracentesis asp pleural space w/img guide · 0.27mm/px · 4 of 4 slices shown]
[im 1/4]
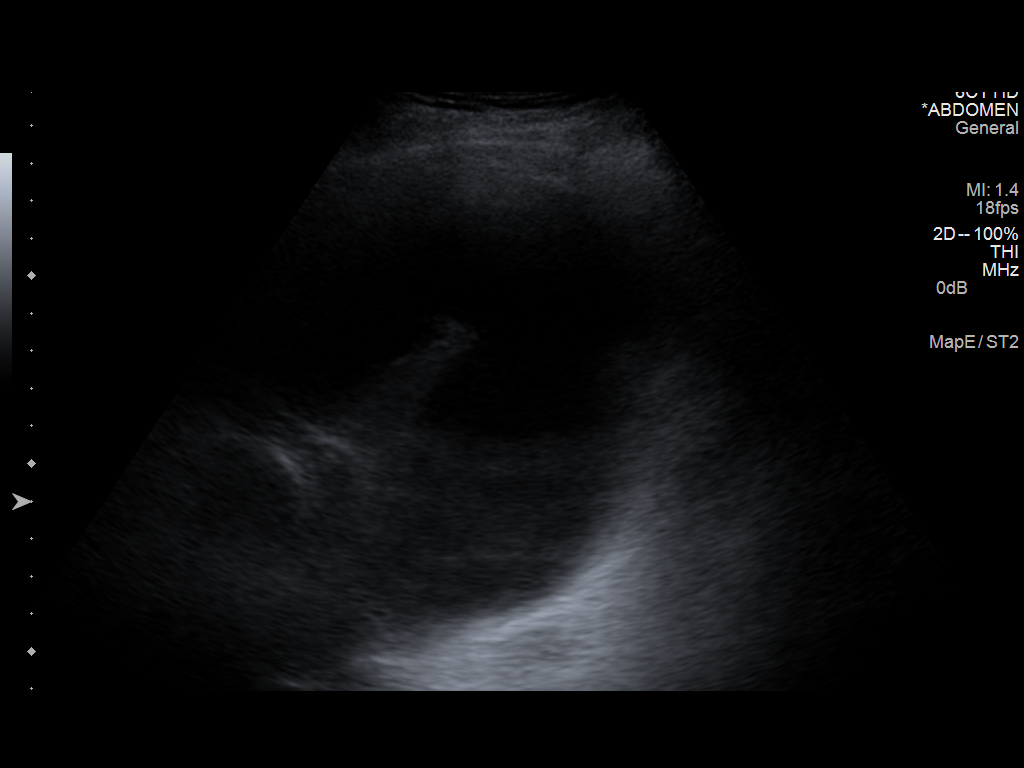
[im 2/4]
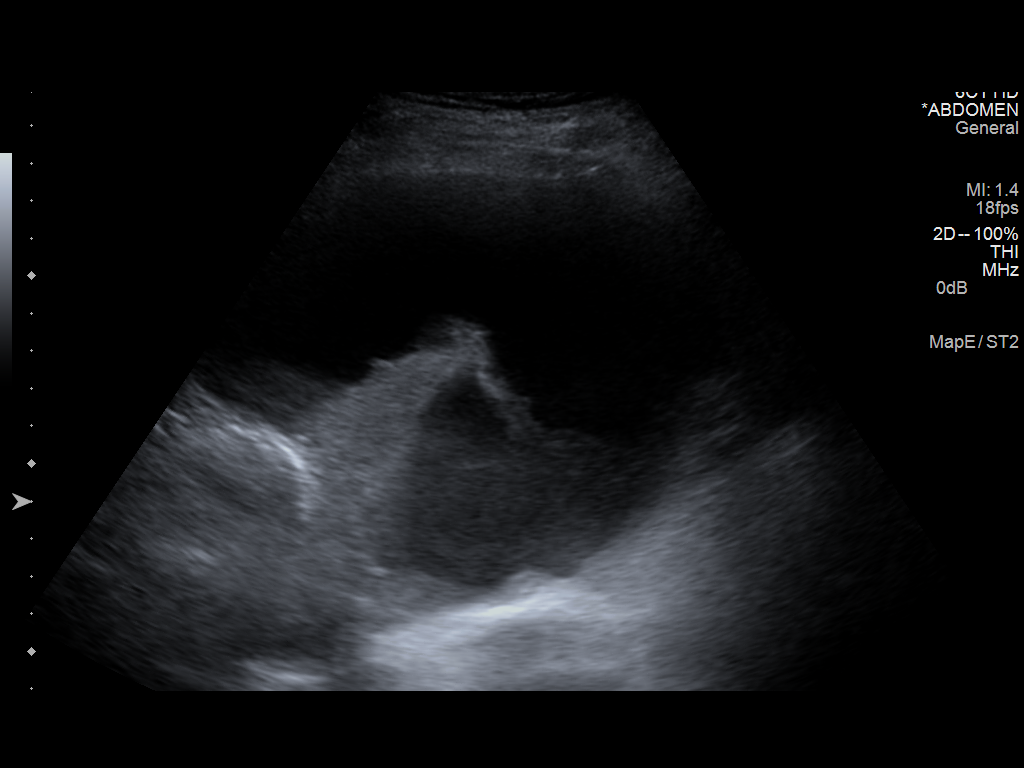
[im 3/4]
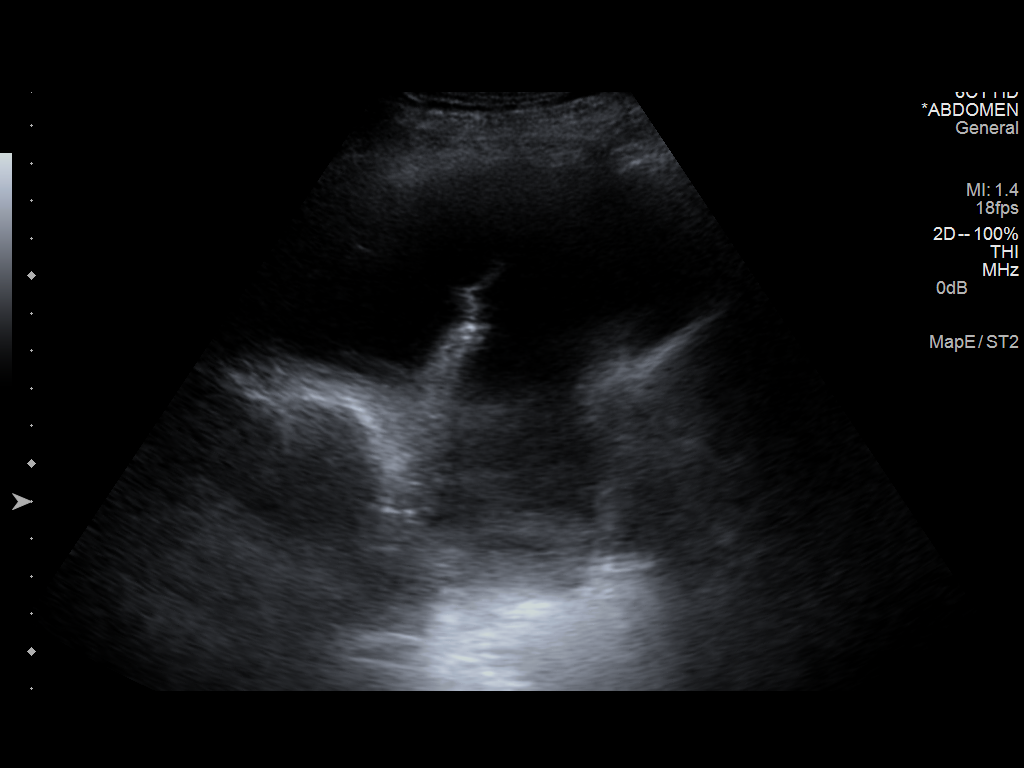
[im 4/4]
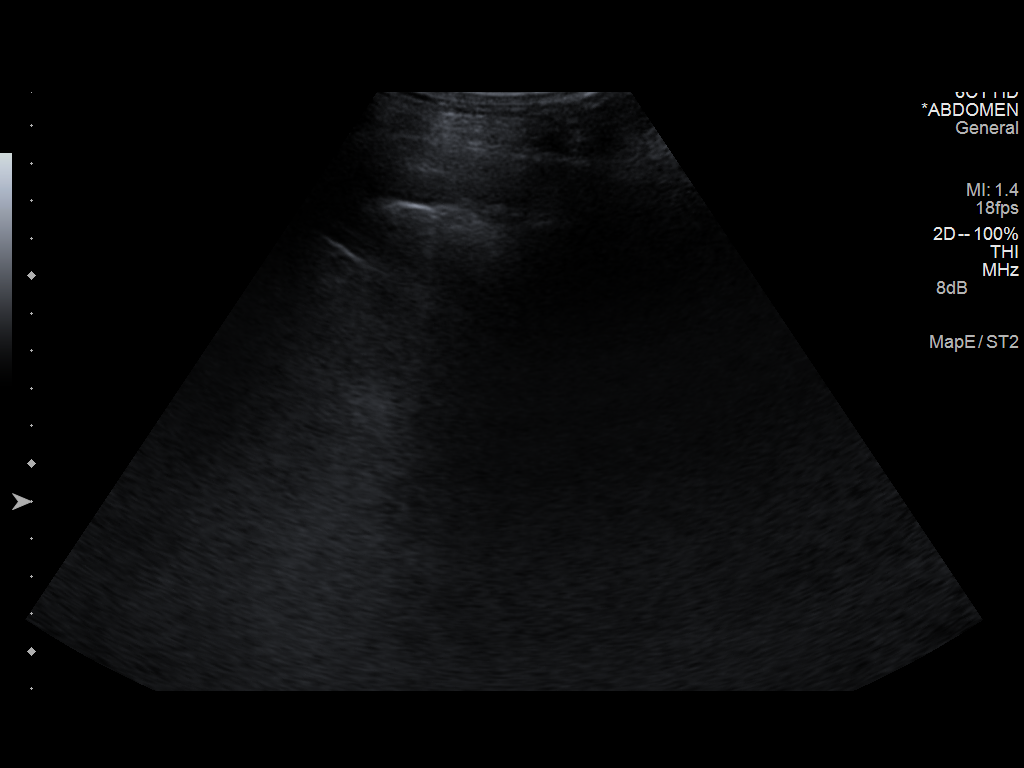

[4 of 4 positions shown; findings below may reference images not displayed]

FINDINGS: A total of approximately 1.1 liters of bloody fluid was removed.
IMPRESSION: Successful ultrasound guided therapeutic right thoracentesis
yielding 1.1 liters of pleural fluid.

PROCEDURE:
An ultrasound guided thoracentesis was thoroughly discussed with the
patient and questions answered. The benefits, risks, alternatives
and complications were also discussed. The patient understands and
wishes to proceed with the procedure. Written consent was obtained.

Ultrasound was performed to localize and mark an adequate pocket of
fluid in the right chest. The area was then prepped and draped in
the normal sterile fashion. 1% Lidocaine was used for local
anesthesia. Under ultrasound guidance a 19 gauge Yueh catheter was
introduced. Thoracentesis was performed. The catheter was removed
and a dressing applied.

Complications:  None immediate

## 2014-12-16 ENCOUNTER — Other Ambulatory Visit: Payer: Self-pay

## 2015-01-14 ENCOUNTER — Encounter (HOSPITAL_COMMUNITY): Payer: Self-pay | Admitting: *Deleted

## 2015-01-14 ENCOUNTER — Other Ambulatory Visit: Payer: Self-pay | Admitting: Ophthalmology

## 2015-01-14 MED ORDER — TETRACAINE HCL 0.5 % OP SOLN: 1.0000 [drp] | OPHTHALMIC | Status: AC

## 2015-01-14 NOTE — H&P (Signed)
History & Physical:   DATE:   12-19-14  NAME:  Jordan Henderson, Jordan Henderson     [id number]       HISTORY OF PRESENT ILLNESS: Chief Eye Complaints glaucoma  patient  is still having trouble seeing at a distance.   patient  denies having any pain.   HPI: EYES: Reports symptoms of vision disturbances.        LOCATION:   BOTH EYES        QUALITY/COURSE:   Reports condition is worsening.        INTENSITY/SEVERITY:    Reports measurement ( or degree) as severe .      DURATION:   Reports the general length of symptoms to be years.      ACTIVE PROBLEMS: Capsular glaucoma with pseudoexfoliation of lens, left eye, indeterminate stage   ICD10: W10.2725  ICD9:   Onset: 11/21/2014 15:10  Initial Date:    Other age-related incipient cataract, left eye   ICD10: H25.092  ICD9:   Onset: 07/23/2014 15:37  Initial Date:     Primary open angle glaucoma   ICD10:   ICD9: 365.11  Onset:   Initial Date:    Visual Fields  OS nasal step 2 spots worse OS Corneal edema   ICD10: H18.20  ICD9: 371.20  Onset:   Initial Date:   OD  anterior chamber  intraocular lens implant  OD  Chronic RD OD   Age-related nuclear cataract, left eye   ICD10: H25.12  ICD9:   Onset: 01/13/2015 08:38  Initial Date:   MEDICATIONS: Lumigan: 0.01% solution SIG-  1 drop OU QHS   QHS OU  Lastacaft: Strength-  SIG-    Amlodipine (Norvasc):   10 mg tablet  SIG-  1 each   once a day   Lisinopril (Prinivil, Zestril):    20 mg tablet    SIG-   1 each     once a day              Hydrochlorothiazide (HCTZ):   12.5 mg capsule  SIG-  1 each   once a day   Gabapentin: Strength-  SIG-  Dose-  Freq-    Crestor: Strength-  SIG-    Ultram (Tramadol):   50 mg tablet  SIG-  1 tab(s)    4 times a day    Aspirin:  81 mg tablet  SIG-  1 each   once a day   Methotrexate:   2.5 mg (tablet)  SIG-  1 tab(s)  to 3 as directed    as directed    Prilosec (Omeprazole):   20 mg delayed release capsule    SIG-   1 each      once a day    Ilevro:  0.3% suspension SIG-  1 drop in OS QD   Ciloxan (Ciprofloxacin) Solution: 0.3% solution SIG-  1 drop in OS BID   Latanoprost Ophthalmic: 0.005% solution SIG-  1 drop in both eyes QHS  Refresh Optive  REVIEW OF SYSTEMS: ROS:   GEN- Constitutional: HENT: GEN - Endocrine: Reports symptoms of LUNGS/Respiratory:  HEART/Cardiovascular: Reports symptoms of hypertension.    ABD/Gastrointestinal:  GERG   Musculoskeletal (BJE): NEURO/Neurological: PSYCH/Psychiatric:    Is the pt oriented to time, place, person? yes  Mood depressed __ normal  agitated __   TOBACCO: Never smoker   ICD10: Z87.898 ICD9: V13.89  Pick List - Tobacco - Summary  SOCIAL HISTORY: Starter Pick List - Social History  FAMILY HISTORY:  Family History - 1st Degree Relatives:  Son alive and well.  Daughter alive and well.  ALLERGIES: Drug Allergies.  No Known.   Starter - Allergies - Summary:  PHYSICAL EXAMINATION: VS: BP: 147/86.  P: 86 /min.  W: 140lbs 0oz.   VS: BP: 147/86.  P: 86 /min.  W: 140lbs 0oz.    Va     OD:cc 20/LP  OS:cc 20/400 PH 20/NI  EYEGLASSES:  OD:+0.75 +0.50 x172  OS:+0.50 +0.25 x042  ADD: +3.00  MR:07/23/2014 14:09   OD:Bal OS:+2.00 +0.50 x 042   20/200 ADD:+3.00  VF:   OD Loss of vision in all four quadrants       OS Decrease vision in all four quadrants  Motility: full versions  PUPILS:  OD nonreactive OS: 4 millimeters reactive  EYELIDS & OCULAR ADNEXA:normal each eye   SLE: Conjunctiva: Conjunctival scarring OD, quiet OS  Cornea: Diffuse corneal edema OD,Arcus OS   Anterior Chamber: JX:BJYN with vaulted anterior chamber intraocular lens implant  WG:NFAOZ   Iris:peripheral iridectomy open OD with irregular pupil, iris gray each eye  Lens: HY:QMVHQION chamber intraocular lens implant  OS: +3-4 BRUNESCENT  nuclear sclerosis with PXF   Vitreous  CCT  Ta   in mmHg    OD 20  irreg miers         OS 18 Time:11/21/2014 14:27   Gonio   Dilation    trop  1%  phenylephrine 2.5%      Fundus: NO VIEW OD, very difficult view retina appears to be attached cannot see detail OS   optic nerve   OD:                                                   OS: poor view    Macula       OD:                                                     GE:XBMW view   Vessels: poor view   Periphery: flat OS      Exam: GENERAL: Appearance: HEAD, EARS, NOSE AND THROAT: Ears-Nose (external) Inspection: Externally, nose and ears are normal in appearance and without scars, lesions, or nodules.      Hearing assessment shows no problems with normal conversation.      LUNGS and RESPIRATORY: Lung auscultation elicits no wheezing, rhonci, rales or rubs and with equal breath sounds.    Respiratory effort described as breathing is unlabored and chest movement is symmetrical.    HEART (Cardiovascular): Heart auscultation discovers gRADE IV/V murmur .  RUSB  ABDOMEN (Gastrointestinal): Mass/Tenderness Exam: Neither are present.     MUSCULOSKELETAL (BJE): Inspection-Palpation: No major bone, joint, tendon, or muscle changes.      NEUROLOGICAL: Alert and oriented. No major deficits of coordination or sensation.      PSYCHIATRIC: Insight and judgment appear  both to be intact and appropriate.    Mood and affect are described as normal mood and full affect.    SKIN: Skin Inspection: No rashes or lesions  ADMITTING  DIAGNOSIS: Capsular glaucoma with pseudoexfoliation of lens, left eye, indeterminate stage   ICD10: Z61.0960  ICD9:   Onset: 11/21/2014 15:10  Initial Date:    Other age-related incipient cataract, left eye   ICD10: H25.092  ICD9:   Onset: 07/23/2014 15:37  Initial Date:     Primary open angle glaucoma   ICD10:   ICD9: 365.11   Visual Fields  OS nasal step 2 spots worse OS Corneal edema   ICD10: H18.20  ICD9: 371.20  OD  anterior chamber  intraocular lens implant  OD  Chronic RD OD   Age-related nuclear cataract, left eye   ICD10: H25.12  ICD9:   Onset:  01/13/2015 08:38  SURGICAL TREATMENT PLAN: discussed  phaco emulsion cataract extraction  with  intraocular lens implant OS    Risk and benefits of surgery have been reviewed with the patient and the patient agrees to proceed with the surgical procedure. reviewed increase  Risk  for PXF.  Handouts: Calcium Channel Blockers, ACE Inhibitors.    ___________________________ Chalmers Guest, Jr. dizziness Starter - Inactive Problems:

## 2015-01-14 NOTE — Progress Notes (Signed)
Jordan Henderson, nurse from [facility] returned my call. She was given pre-op instructions - NPO after MN tonight, pt may take her Prilosec, Meclizine - if needed, Tylenol - if needed with small sip of water in AM. She voiced understanding.

## 2015-01-14 NOTE — Progress Notes (Signed)
Spoke with pt's daughter, Sedalia Muta for pre-op call. She was able to verify allergies, medical and surgical hx. She and her brother will be bring pt here tomorrow. Pt is a resident at [facility]. I gave Diane pre-op instructions but have also called and left a message for Shawna Orleans, nurse for Mrs. Demartini to return my call to give the nursing facility the pre-op instructions also.

## 2015-01-15 ENCOUNTER — Encounter (HOSPITAL_COMMUNITY): Payer: Self-pay | Admitting: *Deleted

## 2015-01-15 ENCOUNTER — Ambulatory Visit (HOSPITAL_COMMUNITY): Payer: Medicare Other | Admitting: Anesthesiology

## 2015-01-15 ENCOUNTER — Ambulatory Visit (HOSPITAL_COMMUNITY)
Admission: RE | Admit: 2015-01-15 | Discharge: 2015-01-15 | Disposition: A | Discharge status: Home / Self Care | Payer: Medicare Other | Source: Ambulatory Visit | Attending: Ophthalmology | Admitting: Ophthalmology

## 2015-01-15 ENCOUNTER — Encounter (HOSPITAL_COMMUNITY)
Admission: RE | Disposition: A | Discharge status: Home / Self Care | Payer: Self-pay | Source: Ambulatory Visit | Attending: Ophthalmology

## 2015-01-15 DIAGNOSIS — Z7982 Long term (current) use of aspirin: Secondary | ICD-10-CM | POA: Insufficient documentation

## 2015-01-15 DIAGNOSIS — Z79899 Other long term (current) drug therapy: Secondary | ICD-10-CM | POA: Diagnosis not present

## 2015-01-15 DIAGNOSIS — H4011X0 Primary open-angle glaucoma, stage unspecified: Secondary | ICD-10-CM | POA: Insufficient documentation

## 2015-01-15 DIAGNOSIS — H269 Unspecified cataract: Secondary | ICD-10-CM | POA: Diagnosis present

## 2015-01-15 DIAGNOSIS — H2512 Age-related nuclear cataract, left eye: Secondary | ICD-10-CM | POA: Insufficient documentation

## 2015-01-15 HISTORY — DX: Unspecified dementia, unspecified severity, without behavioral disturbance, psychotic disturbance, mood disturbance, and anxiety: F03.90

## 2015-01-15 HISTORY — PX: CATARACT EXTRACTION W/PHACO: SHX586

## 2015-01-15 HISTORY — DX: Family history of other specified conditions: Z84.89

## 2015-01-15 HISTORY — DX: Other complications of anesthesia, initial encounter: T88.59XA

## 2015-01-15 HISTORY — DX: Other specified postprocedural states: Z98.890

## 2015-01-15 HISTORY — DX: Adverse effect of unspecified anesthetic, initial encounter: T41.45XA

## 2015-01-15 HISTORY — DX: Nausea with vomiting, unspecified: R11.2

## 2015-01-15 LAB — CBC
HCT: 35.4 % — ABNORMAL LOW (ref 36.0–46.0)
Hemoglobin: 11.1 g/dL — ABNORMAL LOW (ref 12.0–15.0)
MCH: 27.9 pg (ref 26.0–34.0)
MCHC: 31.4 g/dL (ref 30.0–36.0)
MCV: 88.9 fL (ref 78.0–100.0)
PLATELETS: 297 10*3/uL (ref 150–400)
RBC: 3.98 MIL/uL (ref 3.87–5.11)
RDW: 15.1 % (ref 11.5–15.5)
WBC: 6.7 10*3/uL (ref 4.0–10.5)

## 2015-01-15 LAB — BASIC METABOLIC PANEL
Anion gap: 8 (ref 5–15)
BUN: 10 mg/dL (ref 6–20)
CALCIUM: 9.1 mg/dL (ref 8.9–10.3)
CO2: 26 mmol/L (ref 22–32)
CREATININE: 0.75 mg/dL (ref 0.44–1.00)
Chloride: 105 mmol/L (ref 101–111)
GFR calc non Af Amer: >60 mL/min (ref >60)
GLUCOSE: 96 mg/dL (ref 65–99)
POTASSIUM: 3.4 mmol/L — AB (ref 3.5–5.1)
Sodium: 139 mmol/L (ref 135–145)

## 2015-01-15 SURGERY — PHACOEMULSIFICATION, CATARACT, WITH IOL INSERTION
Anesthesia: Monitor Anesthesia Care | Site: Eye | Laterality: Left

## 2015-01-15 MED ORDER — PROPOFOL INFUSION 10 MG/ML OPTIME
INTRAVENOUS | Status: DC | PRN
  Administered 2015-01-15: 25 ug/kg/min via INTRAVENOUS

## 2015-01-15 MED ORDER — PROPOFOL 10 MG/ML IV BOLUS
INTRAVENOUS | Status: DC | PRN
  Administered 2015-01-15: 40 mg via INTRAVENOUS

## 2015-01-15 MED ORDER — LIDOCAINE-EPINEPHRINE 2 %-1:100000 IJ SOLN
INTRAMUSCULAR | Status: DC | PRN
  Administered 2015-01-15: 5 mL via RETROBULBAR

## 2015-01-15 MED ORDER — NA CHONDROIT SULF-NA HYALURON 40-30 MG/ML IO SOLN
INTRAOCULAR | Status: AC
  Filled 2015-01-15: qty 0.5

## 2015-01-15 MED ORDER — CYCLOPENTOLATE HCL 1 % OP SOLN
1.0000 [drp] | OPHTHALMIC | Status: AC
  Administered 2015-01-15 (×3): 1 [drp] via OPHTHALMIC
  Filled 2015-01-15: qty 2

## 2015-01-15 MED ORDER — NA CHONDROIT SULF-NA HYALURON 40-30 MG/ML IO SOLN
INTRAOCULAR | Status: DC | PRN
  Administered 2015-01-15 (×2): 0.5 mL via INTRAOCULAR

## 2015-01-15 MED ORDER — DEXAMETHASONE SODIUM PHOSPHATE 10 MG/ML IJ SOLN
INTRAMUSCULAR | Status: AC
  Filled 2015-01-15: qty 1

## 2015-01-15 MED ORDER — BUPIVACAINE HCL (PF) 0.75 % IJ SOLN
INTRAMUSCULAR | Status: AC
  Filled 2015-01-15: qty 10

## 2015-01-15 MED ORDER — TETRACAINE HCL 0.5 % OP SOLN
OPHTHALMIC | Status: DC | PRN
  Administered 2015-01-15: 1 [drp]

## 2015-01-15 MED ORDER — EPINEPHRINE HCL 1 MG/ML IJ SOLN
INTRAOCULAR | Status: DC | PRN
  Administered 2015-01-15 (×2)

## 2015-01-15 MED ORDER — GATIFLOXACIN 0.5 % OP SOLN
1.0000 [drp] | OPHTHALMIC | Status: AC | PRN
  Administered 2015-01-15 (×3): 1 [drp] via OPHTHALMIC
  Filled 2015-01-15: qty 2.5

## 2015-01-15 MED ORDER — BSS IO SOLN
INTRAOCULAR | Status: AC
  Filled 2015-01-15: qty 15

## 2015-01-15 MED ORDER — HYALURONIDASE HUMAN 150 UNIT/ML IJ SOLN
INTRAMUSCULAR | Status: AC
  Filled 2015-01-15: qty 1

## 2015-01-15 MED ORDER — TETRACAINE HCL 0.5 % OP SOLN
OPHTHALMIC | Status: AC
  Filled 2015-01-15: qty 2

## 2015-01-15 MED ORDER — ACETYLCHOLINE CHLORIDE 20 MG IO SOLR
INTRAOCULAR | Status: DC | PRN
  Administered 2015-01-15: 20 mg via INTRAOCULAR

## 2015-01-15 MED ORDER — LIDOCAINE HCL 2 % IJ SOLN
INTRAMUSCULAR | Status: AC
  Filled 2015-01-15: qty 20

## 2015-01-15 MED ORDER — SODIUM CHLORIDE 0.9 % IV SOLN
INTRAVENOUS | Status: DC
  Administered 2015-01-15 (×2): via INTRAVENOUS

## 2015-01-15 MED ORDER — FENTANYL CITRATE (PF) 100 MCG/2ML IJ SOLN
INTRAMUSCULAR | Status: DC | PRN
  Administered 2015-01-15 (×2): 12.5 ug via INTRAVENOUS

## 2015-01-15 MED ORDER — PROPOFOL 10 MG/ML IV BOLUS
INTRAVENOUS | Status: AC
  Filled 2015-01-15: qty 40

## 2015-01-15 MED ORDER — BSS IO SOLN
INTRAOCULAR | Status: AC
  Filled 2015-01-15: qty 500

## 2015-01-15 MED ORDER — MIDAZOLAM HCL 2 MG/2ML IJ SOLN
INTRAMUSCULAR | Status: AC
  Filled 2015-01-15: qty 2

## 2015-01-15 MED ORDER — PROPOFOL 10 MG/ML IV BOLUS
INTRAVENOUS | Status: AC
  Filled 2015-01-15: qty 20

## 2015-01-15 MED ORDER — LIDOCAINE-EPINEPHRINE 2 %-1:100000 IJ SOLN
INTRAMUSCULAR | Status: AC
  Filled 2015-01-15: qty 1

## 2015-01-15 MED ORDER — PILOCARPINE HCL 4 % OP SOLN
OPHTHALMIC | Status: AC
  Filled 2015-01-15: qty 15

## 2015-01-15 MED ORDER — SODIUM HYALURONATE 10 MG/ML IO SOLN
INTRAOCULAR | Status: AC
  Filled 2015-01-15: qty 0.85

## 2015-01-15 MED ORDER — 0.9 % SODIUM CHLORIDE (POUR BTL) OPTIME
TOPICAL | Status: DC | PRN
  Administered 2015-01-15: 100 mL

## 2015-01-15 MED ORDER — ACETYLCHOLINE CHLORIDE 20 MG IO SOLR
INTRAOCULAR | Status: AC
  Filled 2015-01-15: qty 1

## 2015-01-15 MED ORDER — GENTAMICIN SULFATE 40 MG/ML IJ SOLN
INTRAMUSCULAR | Status: AC
  Filled 2015-01-15: qty 2

## 2015-01-15 MED ORDER — SODIUM HYALURONATE 10 MG/ML IO SOLN
INTRAOCULAR | Status: DC | PRN
  Administered 2015-01-15: 0.85 mL via INTRAOCULAR

## 2015-01-15 MED ORDER — BSS IO SOLN
INTRAOCULAR | Status: DC | PRN
  Administered 2015-01-15: 15 mL

## 2015-01-15 MED ORDER — FENTANYL CITRATE (PF) 250 MCG/5ML IJ SOLN
INTRAMUSCULAR | Status: AC
  Filled 2015-01-15: qty 5

## 2015-01-15 MED ORDER — TROPICAMIDE 1 % OP SOLN
1.0000 [drp] | OPHTHALMIC | Status: AC
  Administered 2015-01-15 (×3): 1 [drp] via OPHTHALMIC
  Filled 2015-01-15: qty 3

## 2015-01-15 MED ORDER — TOBRAMYCIN-DEXAMETHASONE 0.3-0.1 % OP OINT
TOPICAL_OINTMENT | OPHTHALMIC | Status: AC
  Filled 2015-01-15: qty 3.5

## 2015-01-15 MED ORDER — TOBRAMYCIN 0.3 % OP OINT
TOPICAL_OINTMENT | OPHTHALMIC | Status: DC | PRN
  Administered 2015-01-15: 1 via OPHTHALMIC

## 2015-01-15 MED ORDER — EPINEPHRINE HCL 1 MG/ML IJ SOLN
INTRAMUSCULAR | Status: AC
  Filled 2015-01-15: qty 1

## 2015-01-15 MED ORDER — PHENYLEPHRINE HCL 2.5 % OP SOLN
1.0000 [drp] | OPHTHALMIC | Status: AC
  Administered 2015-01-15 (×3): 1 [drp] via OPHTHALMIC
  Filled 2015-01-15: qty 2

## 2015-01-15 SURGICAL SUPPLY — 37 items
APL SRG 3 HI ABS STRL LF PLS (MISCELLANEOUS) ×1
APPLICATOR COTTON TIP 6IN STRL (MISCELLANEOUS) ×3 IMPLANT
APPLICATOR DR MATTHEWS STRL (MISCELLANEOUS) ×3 IMPLANT
BLADE KERATOME 2.75 (BLADE) ×2 IMPLANT
BLADE KERATOME 2.75MM (BLADE) ×1
BLADE MINI 60D BLUE (BLADE) ×2 IMPLANT
CANNULA ANTERIOR CHAMBER 27GA (MISCELLANEOUS) ×3 IMPLANT
CORDS BIPOLAR (ELECTRODE) ×2 IMPLANT
COVER MAYO STAND STRL (DRAPES) ×3 IMPLANT
DRAPE OPHTHALMIC 40X48 W POUCH (DRAPES) ×3 IMPLANT
DRAPE RETRACTOR (MISCELLANEOUS) ×3 IMPLANT
GLOVE BIO SURGEON STRL SZ8 (GLOVE) ×3 IMPLANT
GOWN STRL REUS W/ TWL LRG LVL3 (GOWN DISPOSABLE) ×2 IMPLANT
GOWN STRL REUS W/TWL LRG LVL3 (GOWN DISPOSABLE) ×6
KIT BASIN OR (CUSTOM PROCEDURE TRAY) ×3 IMPLANT
KIT ROOM TURNOVER OR (KITS) ×3 IMPLANT
LENS IOL ACRSF IQ PC 19.5 (Intraocular Lens) IMPLANT
LENS IOL ACRYSOF IQ POST 19.5 (Intraocular Lens) ×3 IMPLANT
NDL 18GX1X1/2 (RX/OR ONLY) (NEEDLE) ×1 IMPLANT
NDL 25GX 5/8IN NON SAFETY (NEEDLE) ×1 IMPLANT
NDL FILTER BLUNT 18X1 1/2 (NEEDLE) ×1 IMPLANT
NEEDLE 18GX1X1/2 (RX/OR ONLY) (NEEDLE) IMPLANT
NEEDLE 25GX 5/8IN NON SAFETY (NEEDLE) ×3 IMPLANT
NEEDLE FILTER BLUNT 18X 1/2SAF (NEEDLE) ×2
NEEDLE FILTER BLUNT 18X1 1/2 (NEEDLE) ×1 IMPLANT
NS IRRIG 1000ML POUR BTL (IV SOLUTION) ×3 IMPLANT
PACK CATARACT CUSTOM (CUSTOM PROCEDURE TRAY) ×3 IMPLANT
PAD ARMBOARD 7.5X6 YLW CONV (MISCELLANEOUS) ×5 IMPLANT
PAK PIK CVS CATARACT (OPHTHALMIC) ×3 IMPLANT
SUT ETHILON 10 0 CS140 6 (SUTURE) ×2 IMPLANT
SUT SILK 6 0 G 6 (SUTURE) IMPLANT
SUT VICRYL 7 0 TG140 8 (SUTURE) ×2 IMPLANT
SYR TB 1ML LUER SLIP (SYRINGE) ×3 IMPLANT
TIP ABS 45DEG FLARED 0.9MM (TIP) ×3 IMPLANT
TOWEL OR 17X26 10 PK STRL BLUE (TOWEL DISPOSABLE) ×1 IMPLANT
WATER STERILE IRR 1000ML POUR (IV SOLUTION) ×3 IMPLANT
WIPE INSTRUMENT VISIWIPE 73X73 (MISCELLANEOUS) ×1 IMPLANT

## 2015-01-15 NOTE — Interval H&P Note (Signed)
History and Physical Interval Note:  01/15/2015 9:52 AM  Jordan Henderson  has presented today for surgery, with the diagnosis of AGE RETALTED NEURO CATARACT LEFT EYE   The various methods of treatment have been discussed with the patient and family. After consideration of risks, benefits and other options for treatment, the patient has consented to  Procedure(s): PHACOEMULSIFICATION CATARACT EXCISION WITH INTRAOCULAR LENS PLACEMENT LEFT EYE  (IOC) (Left) as a surgical intervention .  The patient's history has been reviewed, patient examined, no change in status, stable for surgery.  I have reviewed the patient's chart and labs.  Questions were answered to the patient's satisfaction.     Rivan Siordia

## 2015-01-15 NOTE — Op Note (Signed)
Preoperative diagnosis: Mature cataract with pseudoexfoliation left eye Postop diagnosis: Same Procedure: Phacoemulsification with intraocular lens implant Combinations: None Assistant: Higinio Roger Anesthesia: 2% Xylocaine with epinephrine a 50-50 mixture 0.75% Marcaine with ample Wydase Procedure: The patient is transported to the operating room where she was given a peribulbar block with the aforementioned local and aesthetic agent. Following this the patient's face is prepped and draped in the usual sterile fashion with a surgeon sitting at the 12:00 position in anticipation of a possible extracapsular cataract surgery a Weck-Cel sponges used to fixate the globe and a 15 blade was used to enter through clear cornea at the 2:00 position and Viscoat was injected in the anterior chamber following this using a Wescott scissors an incision was made at the superior nasal limbus and the conjunctiva forming a small conjunctival flap T9 tissue were recess with the Tooke blade using a 69 blade an incision was made 1 mm posterior to the sclera and dissection was carried out into clear cornea using a 2.75 mm keratome blade anterior chamber was entered additional Viscoat was injected in the eye it was noted that the cataract was 4+ mature brown cataract using a bent 25-gauge needle a continuous tear curvilinear capsulorrhexis was formed and anterior capsule was removed with the Utrata forceps. Following this BSS was used to gently hydrodissect the nucleus following this the phacoemulsification unit was then used to remove the epinucleus the nucleus was sculpted centrally the nucleus was noted to BV area hard after deep central sculpting the nucleus was cracked and rotated following this with deep central sculpting again the nucleus was separated using the snapper hook and the phacotip tip the nucleus was rotated several times dividing the nucleus into 3 quadrants following this the nuclear fragment was aspirated high over  the patient move her head and all instruments were removed from the eye viscoelastic was reinjected in the eye the nuclear fragment was then brought up the fake oh power was increased to 60% and the nuclear fragments were then very slowly aspirated into the phacotip the heart nuclear fragments were eventually removed and the posterior capsule remained intact. The irrigation-aspiration device was used to remove cortical fibers after all court a fibers of been removed Provisc was injected in the anterior chamber the intraocular lens implant was examined and noted to have no defects the lens was an Alcon AcrySof SN 60 WF IQ 19.5 dpt lens SN number [id number] the lens is placed in the lens injector and injected in the capsular bag it was rotated into position with a Kuglen hook the irrigation-aspiration device was then used to remove viscoelastic from the eye Miochol was injected in the eye the eye was pressurized and there was no leakage the conjunctiva was then sutured with a 70 Vicryls suture because the assistant could not get the cautery working. The incision was checked and there was no leakage. Tobradex was applied to the eye a patch and Fox U were placed and the patient returned to recovery area in stable condition. Chalmers Guest Junior M.D.

## 2015-01-15 NOTE — H&P (View-Only) (Signed)
History & Physical:   DATE:   12-19-14  NAME:  Jordan Henderson, Jordan Henderson     [id number]       HISTORY OF PRESENT ILLNESS: Chief Eye Complaints glaucoma  patient  is still having trouble seeing at a distance.   patient  denies having any pain.   HPI: EYES: Reports symptoms of vision disturbances.        LOCATION:   BOTH EYES        QUALITY/COURSE:   Reports condition is worsening.        INTENSITY/SEVERITY:    Reports measurement ( or degree) as severe .      DURATION:   Reports the general length of symptoms to be years.      ACTIVE PROBLEMS: Capsular glaucoma with pseudoexfoliation of lens, left eye, indeterminate stage   ICD10: W10.2725  ICD9:   Onset: 11/21/2014 15:10  Initial Date:    Other age-related incipient cataract, left eye   ICD10: H25.092  ICD9:   Onset: 07/23/2014 15:37  Initial Date:     Primary open angle glaucoma   ICD10:   ICD9: 365.11  Onset:   Initial Date:    Visual Fields  OS nasal step 2 spots worse OS Corneal edema   ICD10: H18.20  ICD9: 371.20  Onset:   Initial Date:   OD  anterior chamber  intraocular lens implant  OD  Chronic RD OD   Age-related nuclear cataract, left eye   ICD10: H25.12  ICD9:   Onset: 01/13/2015 08:38  Initial Date:   MEDICATIONS: Lumigan: 0.01% solution SIG-  1 drop OU QHS   QHS OU  Lastacaft: Strength-  SIG-    Amlodipine (Norvasc):   10 mg tablet  SIG-  1 each   once a day   Lisinopril (Prinivil, Zestril):    20 mg tablet    SIG-   1 each     once a day              Hydrochlorothiazide (HCTZ):   12.5 mg capsule  SIG-  1 each   once a day   Gabapentin: Strength-  SIG-  Dose-  Freq-    Crestor: Strength-  SIG-    Ultram (Tramadol):   50 mg tablet  SIG-  1 tab(s)    4 times a day    Aspirin:  81 mg tablet  SIG-  1 each   once a day   Methotrexate:   2.5 mg (tablet)  SIG-  1 tab(s)  to 3 as directed    as directed    Prilosec (Omeprazole):   20 mg delayed release capsule    SIG-   1 each      once a day    Ilevro:  0.3% suspension SIG-  1 drop in OS QD   Ciloxan (Ciprofloxacin) Solution: 0.3% solution SIG-  1 drop in OS BID   Latanoprost Ophthalmic: 0.005% solution SIG-  1 drop in both eyes QHS  Refresh Optive  REVIEW OF SYSTEMS: ROS:   GEN- Constitutional: HENT: GEN - Endocrine: Reports symptoms of LUNGS/Respiratory:  HEART/Cardiovascular: Reports symptoms of hypertension.    ABD/Gastrointestinal:  GERG   Musculoskeletal (BJE): NEURO/Neurological: PSYCH/Psychiatric:    Is the pt oriented to time, place, person? yes  Mood depressed __ normal  agitated __   TOBACCO: Never smoker   ICD10: Z87.898 ICD9: V13.89  Pick List - Tobacco - Summary  SOCIAL HISTORY: Starter Pick List - Social History  FAMILY HISTORY:  Family History - 1st Degree Relatives:  Son alive and well.  Daughter alive and well.  ALLERGIES: Drug Allergies.  No Known.   Starter - Allergies - Summary:  PHYSICAL EXAMINATION: VS: BP: 147/86.  P: 86 /min.  W: 140lbs 0oz.   VS: BP: 147/86.  P: 86 /min.  W: 140lbs 0oz.    Va     OD:cc 20/LP  OS:cc 20/400 PH 20/NI  EYEGLASSES:  OD:+0.75 +0.50 x172  OS:+0.50 +0.25 x042  ADD: +3.00  MR:07/23/2014 14:09   OD:Bal OS:+2.00 +0.50 x 042   20/200 ADD:+3.00  VF:   OD Loss of vision in all four quadrants       OS Decrease vision in all four quadrants  Motility: full versions  PUPILS:  OD nonreactive OS: 4 millimeters reactive  EYELIDS & OCULAR ADNEXA:normal each eye   SLE: Conjunctiva: Conjunctival scarring OD, quiet OS  Cornea: Diffuse corneal edema OD,Arcus OS   Anterior Chamber: JX:BJYN with vaulted anterior chamber intraocular lens implant  WG:NFAOZ   Iris:peripheral iridectomy open OD with irregular pupil, iris gray each eye  Lens: HY:QMVHQION chamber intraocular lens implant  OS: +3-4 BRUNESCENT  nuclear sclerosis with PXF   Vitreous  CCT  Ta   in mmHg    OD 20  irreg miers         OS 18 Time:11/21/2014 14:27   Gonio   Dilation    trop  1%  phenylephrine 2.5%      Fundus: NO VIEW OD, very difficult view retina appears to be attached cannot see detail OS   optic nerve   OD:                                                   OS: poor view    Macula       OD:                                                     GE:XBMW view   Vessels: poor view   Periphery: flat OS      Exam: GENERAL: Appearance: HEAD, EARS, NOSE AND THROAT: Ears-Nose (external) Inspection: Externally, nose and ears are normal in appearance and without scars, lesions, or nodules.      Hearing assessment shows no problems with normal conversation.      LUNGS and RESPIRATORY: Lung auscultation elicits no wheezing, rhonci, rales or rubs and with equal breath sounds.    Respiratory effort described as breathing is unlabored and chest movement is symmetrical.    HEART (Cardiovascular): Heart auscultation discovers gRADE IV/V murmur .  RUSB  ABDOMEN (Gastrointestinal): Mass/Tenderness Exam: Neither are present.     MUSCULOSKELETAL (BJE): Inspection-Palpation: No major bone, joint, tendon, or muscle changes.      NEUROLOGICAL: Alert and oriented. No major deficits of coordination or sensation.      PSYCHIATRIC: Insight and judgment appear  both to be intact and appropriate.    Mood and affect are described as normal mood and full affect.    SKIN: Skin Inspection: No rashes or lesions  ADMITTING  DIAGNOSIS: Capsular glaucoma with pseudoexfoliation of lens, left eye, indeterminate stage   ICD10: Z61.0960  ICD9:   Onset: 11/21/2014 15:10  Initial Date:    Other age-related incipient cataract, left eye   ICD10: H25.092  ICD9:   Onset: 07/23/2014 15:37  Initial Date:     Primary open angle glaucoma   ICD10:   ICD9: 365.11   Visual Fields  OS nasal step 2 spots worse OS Corneal edema   ICD10: H18.20  ICD9: 371.20  OD  anterior chamber  intraocular lens implant  OD  Chronic RD OD   Age-related nuclear cataract, left eye   ICD10: H25.12  ICD9:   Onset:  01/13/2015 08:38  SURGICAL TREATMENT PLAN: discussed  phaco emulsion cataract extraction  with  intraocular lens implant OS    Risk and benefits of surgery have been reviewed with the patient and the patient agrees to proceed with the surgical procedure. reviewed increase  Risk  for PXF.  Handouts: Calcium Channel Blockers, ACE Inhibitors.    ___________________________ Chalmers Guest, Jr. dizziness Starter - Inactive Problems:

## 2015-01-15 NOTE — Anesthesia Preprocedure Evaluation (Addendum)
Anesthesia Evaluation  Patient identified by MRN, date of birth, ID band Patient awake  General Assessment Comment:Daughter speaking for pt much of time; pt has mild dementia  Reviewed: Allergy & Precautions, NPO status , Patient's Chart, lab work & pertinent test results, reviewed documented beta blocker date and time   History of Anesthesia Complications (+) PONV and history of anesthetic complications  Airway Mallampati: II  TM Distance: >3 FB Neck ROM: Full    Dental  (+) Teeth Intact   Pulmonary former smoker,  breath sounds clear to auscultation        Cardiovascular hypertension, Pt. on medications Rhythm:Regular     Neuro/Psych PSYCHIATRIC DISORDERS Depression negative neurological ROS     GI/Hepatic GERD-  Medicated and Controlled,  Endo/Other    Renal/GU      Musculoskeletal  (+) Arthritis -,   Abdominal   Peds  Hematology  (+) anemia ,   Anesthesia Other Findings   Reproductive/Obstetrics                           Anesthesia Physical Anesthesia Plan  ASA: III  Anesthesia Plan: MAC   Post-op Pain Management:    Induction:   Airway Management Planned: Nasal Cannula  Additional Equipment:   Intra-op Plan:   Post-operative Plan:   Informed Consent: I have reviewed the patients History and Physical, chart, labs and discussed the procedure including the risks, benefits and alternatives for the proposed anesthesia with the patient or authorized representative who has indicated his/her understanding and acceptance.   Dental advisory given  Plan Discussed with: CRNA and Surgeon  Anesthesia Plan Comments:        Anesthesia Quick Evaluation

## 2015-01-15 NOTE — Transfer of Care (Signed)
Immediate Anesthesia Transfer of Care Note  Patient: Jordan Henderson  Procedure(s) Performed: Procedure(s): PHACOEMULSIFICATION CATARACT EXCISION WITH INTRAOCULAR LENS PLACEMENT LEFT EYE  (IOC) (Left)  Patient Location: PACU  Anesthesia Type:MAC  Level of Consciousness: awake and patient cooperative  Airway & Oxygen Therapy: Patient Spontanous Breathing  Post-op Assessment: Report given to RN and Patient moving all extremities  Post vital signs: Reviewed and stable  Last Vitals:  Filed Vitals:   01/15/15 0807  BP: 146/87  Pulse: 65  Temp: 36.9 C  Resp: 20    Complications: No apparent anesthesia complications

## 2015-01-15 NOTE — Discharge Instructions (Signed)
The patient may remove the eye patch today at 2:30 PM. Instilled the eyedrops given to the patient at doctor's office today after the patches removed and use again tomorrow morning. The patient should avoid rubbing the eye sleep on back or right side no heavy lifting bending or straining. Use the eye she'll over the eye while sleeping tonight.

## 2015-01-16 ENCOUNTER — Encounter (HOSPITAL_COMMUNITY): Payer: Self-pay | Admitting: Ophthalmology

## 2015-01-16 NOTE — OR Nursing (Signed)
Wound class for procedure performed on 01/15/2015 changed from clean to contaminated. Relative humidity of OR suite exceeded 60% during surgery. 

## 2015-01-17 NOTE — Anesthesia Postprocedure Evaluation (Signed)
  Anesthesia Post-op Note  Patient: Jordan Henderson  Procedure(s) Performed: Procedure(s): PHACOEMULSIFICATION CATARACT EXCISION WITH INTRAOCULAR LENS PLACEMENT LEFT EYE  (IOC) (Left)  Patient Location: PACU  Anesthesia Type:MAC  Level of Consciousness: awake  Airway and Oxygen Therapy: Patient Spontanous Breathing  Post-op Pain: none  Post-op Assessment: Post-op Vital signs reviewed, Patient's Cardiovascular Status Stable, Respiratory Function Stable, Patent Airway, No signs of Nausea or vomiting and Pain level controlled              Post-op Vital Signs: Reviewed and stable  Last Vitals:  Filed Vitals:   01/15/15 1156  BP: 154/62  Pulse: 62  Temp:   Resp:     Complications: No apparent anesthesia complications

## 2019-03-22 DEATH — deceased
